# Patient Record
Sex: Male | Born: 1965 | Race: White | Hispanic: No | Marital: Married | Smoking: Never smoker
Health system: Southern US, Community
[De-identification: ages and names within clinical notes are randomized; demographics above are authoritative.]

## PROBLEM LIST (undated history)

## (undated) DIAGNOSIS — E119 Type 2 diabetes mellitus without complications: Secondary | ICD-10-CM

## (undated) DIAGNOSIS — G4730 Sleep apnea, unspecified: Secondary | ICD-10-CM

## (undated) DIAGNOSIS — Z8669 Personal history of other diseases of the nervous system and sense organs: Secondary | ICD-10-CM

## (undated) DIAGNOSIS — Z8719 Personal history of other diseases of the digestive system: Secondary | ICD-10-CM

## (undated) DIAGNOSIS — R7303 Prediabetes: Secondary | ICD-10-CM

## (undated) DIAGNOSIS — I10 Essential (primary) hypertension: Secondary | ICD-10-CM

## (undated) DIAGNOSIS — M48062 Spinal stenosis, lumbar region with neurogenic claudication: Secondary | ICD-10-CM

## (undated) HISTORY — DX: Type 2 diabetes mellitus without complications: E11.9

## (undated) HISTORY — PX: NO PAST SURGERIES: SHX2092

---

## 2004-11-30 ENCOUNTER — Ambulatory Visit: Payer: Self-pay | Admitting: Unknown Physician Specialty

## 2006-02-25 ENCOUNTER — Emergency Department: Payer: Self-pay | Admitting: Unknown Physician Specialty

## 2009-08-12 ENCOUNTER — Ambulatory Visit: Payer: Self-pay | Admitting: Internal Medicine

## 2010-02-17 ENCOUNTER — Ambulatory Visit: Payer: Self-pay | Admitting: Internal Medicine

## 2011-02-12 ENCOUNTER — Ambulatory Visit: Payer: Self-pay | Admitting: Family Medicine

## 2013-08-07 ENCOUNTER — Emergency Department: Payer: Self-pay | Admitting: Emergency Medicine

## 2013-08-07 LAB — CBC WITH DIFFERENTIAL/PLATELET
Eosinophil #: 0.1 10*3/uL (ref 0.0–0.7)
Eosinophil %: 0.9 %
HGB: 14.1 g/dL (ref 13.0–18.0)
Lymphocyte #: 1.8 10*3/uL (ref 1.0–3.6)
MCHC: 34.2 g/dL (ref 32.0–36.0)
MCV: 84 fL (ref 80–100)
Monocyte #: 0.4 x10 3/mm (ref 0.2–1.0)
Monocyte %: 6.9 %
Neutrophil #: 4 10*3/uL (ref 1.4–6.5)
RBC: 4.89 10*6/uL (ref 4.40–5.90)

## 2013-08-07 LAB — COMPREHENSIVE METABOLIC PANEL
Albumin: 3.9 g/dL (ref 3.4–5.0)
Alkaline Phosphatase: 75 U/L (ref 50–136)
Anion Gap: 5 — ABNORMAL LOW (ref 7–16)
BUN: 14 mg/dL (ref 7–18)
Chloride: 103 mmol/L (ref 98–107)
Creatinine: 0.92 mg/dL (ref 0.60–1.30)
EGFR (African American): >60
EGFR (Non-African Amer.): >60
Glucose: 234 mg/dL — ABNORMAL HIGH (ref 65–99)
Osmolality: 280 (ref 275–301)
Potassium: 3.5 mmol/L (ref 3.5–5.1)
SGPT (ALT): 34 U/L (ref 12–78)
Sodium: 136 mmol/L (ref 136–145)
Total Protein: 7.2 g/dL (ref 6.4–8.2)

## 2013-08-07 LAB — CK TOTAL AND CKMB (NOT AT ARMC)
CK, Total: 296 U/L — ABNORMAL HIGH (ref 35–232)
CK-MB: 3.8 ng/mL — ABNORMAL HIGH (ref 0.5–3.6)

## 2014-03-21 ENCOUNTER — Ambulatory Visit: Payer: Self-pay

## 2014-03-23 ENCOUNTER — Ambulatory Visit: Payer: Self-pay

## 2014-04-08 ENCOUNTER — Ambulatory Visit: Payer: Self-pay | Admitting: Unknown Physician Specialty

## 2014-06-04 ENCOUNTER — Other Ambulatory Visit: Payer: Self-pay | Admitting: Neurological Surgery

## 2014-06-04 DIAGNOSIS — M5127 Other intervertebral disc displacement, lumbosacral region: Secondary | ICD-10-CM

## 2014-06-06 ENCOUNTER — Ambulatory Visit
Admission: RE | Admit: 2014-06-06 | Discharge: 2014-06-06 | Disposition: A | Discharge status: Home / Self Care | Payer: BC Managed Care – PPO | Source: Ambulatory Visit | Attending: Neurological Surgery | Admitting: Neurological Surgery

## 2014-06-06 VITALS — BP 135/89 | HR 67

## 2014-06-06 DIAGNOSIS — M5127 Other intervertebral disc displacement, lumbosacral region: Secondary | ICD-10-CM

## 2014-06-06 MED ORDER — METHYLPREDNISOLONE ACETATE 40 MG/ML INJ SUSP (RADIOLOG
120.0000 mg | Freq: Once | INTRAMUSCULAR | Status: AC
Start: 2014-06-06 — End: 2014-06-06
  Administered 2014-06-06: 120 mg via EPIDURAL

## 2014-06-06 MED ORDER — IOHEXOL 180 MG/ML  SOLN
1.0000 mL | Freq: Once | INTRAMUSCULAR | Status: AC | PRN
  Administered 2014-06-06: 1 mL via EPIDURAL

## 2014-06-06 NOTE — Discharge Instructions (Signed)

## 2018-03-19 ENCOUNTER — Other Ambulatory Visit: Payer: Self-pay

## 2018-03-19 ENCOUNTER — Emergency Department
Admission: EM | Admit: 2018-03-19 | Discharge: 2018-03-19 | Disposition: A | Discharge status: Home / Self Care | Payer: BLUE CROSS/BLUE SHIELD | Attending: Emergency Medicine | Admitting: Emergency Medicine

## 2018-03-19 DIAGNOSIS — Z79899 Other long term (current) drug therapy: Secondary | ICD-10-CM | POA: Diagnosis not present

## 2018-03-19 DIAGNOSIS — M5416 Radiculopathy, lumbar region: Secondary | ICD-10-CM | POA: Diagnosis not present

## 2018-03-19 DIAGNOSIS — M545 Low back pain: Secondary | ICD-10-CM | POA: Diagnosis present

## 2018-03-19 MED ORDER — KETOROLAC TROMETHAMINE 60 MG/2ML IM SOLN
60.0000 mg | Freq: Once | INTRAMUSCULAR | Status: AC
  Administered 2018-03-19: 60 mg via INTRAMUSCULAR
  Filled 2018-03-19: qty 2

## 2018-03-19 MED ORDER — DEXAMETHASONE SODIUM PHOSPHATE 10 MG/ML IJ SOLN
10.0000 mg | Freq: Once | INTRAMUSCULAR | Status: AC
  Administered 2018-03-19: 10 mg via INTRAMUSCULAR
  Filled 2018-03-19: qty 1

## 2018-03-19 MED ORDER — OXYCODONE-ACETAMINOPHEN 7.5-325 MG PO TABS: 1.0000 | ORAL_TABLET | Freq: Four times a day (QID) | ORAL | 0 refills | Status: DC | PRN

## 2018-03-19 MED ORDER — HYDROMORPHONE HCL 1 MG/ML IJ SOLN
1.0000 mg | Freq: Once | INTRAMUSCULAR | Status: AC
  Administered 2018-03-19: 1 mg via INTRAMUSCULAR
  Filled 2018-03-19: qty 1

## 2018-03-19 NOTE — ED Provider Notes (Signed)
[facility] Emergency Department Provider Note   ____________________________________________   First MD Initiated Contact with Patient 03/19/18 [phone]     (approximate)  I have reviewed the triage vital signs and the nursing notes.   HISTORY  Chief Complaint Back Pain    HPI Johnathan Williams is a 52 y.o. male patient complaint acute onset of low back pain 3 days ago.  Patient went to urgent care clinic and was given a prescription for Medrol Dosepak and Flexeril.  Patient states medication is not helping.  Patient has a history of herniated and bulging disks.  Patient stated unable to see treating orthopedics over the weekend.  Patient rates his pain as a 10/10.  Patient denies bladder or bowel dysfunction.  Patient state pain radiates to bilateral lower extremity.  Patient described the pain as "shooting".  No past medical history on file.  There are no active problems to display for this patient.     Prior to Admission medications   Medication Sig Start Date End Date Taking? Authorizing Provider  meloxicam (MOBIC) 15 MG tablet Take by mouth.    [provider]  orphenadrine (NORFLEX) 100 MG tablet Take by mouth.    [provider]  oxyCODONE (OXY IR/ROXICODONE) 5 MG immediate release tablet Take by mouth. 03/26/14   [provider]  oxyCODONE-acetaminophen (PERCOCET) 7.5-325 MG tablet Take 1 tablet by mouth every 6 (six) hours as needed for severe pain. 03/19/18   Joni Reining, PA-C  predniSONE (DELTASONE) 10 MG tablet Take by mouth.    [provider]    Allergies Patient has no known allergies.  No family history on file.  Social History Social History   Tobacco Use  . Smoking status: Never Smoker  . Smokeless tobacco: Never Used  Substance Use Topics  . Alcohol use: Not on file  . Drug use: Not on file    Review of Systems Constitutional: No fever/chills Eyes: No visual changes. ENT: No sore  throat. Cardiovascular: Denies chest pain. Respiratory: Denies shortness of breath. Gastrointestinal: No abdominal pain.  No nausea, no vomiting.  No diarrhea.  No constipation. Genitourinary: Negative for dysuria. Musculoskeletal: Positive for back pain. Skin: Negative for rash. Neurological: Negative for headaches, focal weakness or numbness.   ____________________________________________   PHYSICAL EXAM:  VITAL SIGNS: ED Triage Vitals  Enc Vitals Group     BP 03/19/18 0111 (!) 149/87     Pulse Rate 03/19/18 0111 86     Resp 03/19/18 0111 20     Temp 03/19/18 0111 97.7 F (36.5 C)     Temp Source 03/19/18 0111 Oral     SpO2 03/19/18 0111 97 %     Weight 03/19/18 0110 250 lb (113.4 kg)     Height 03/19/18 0110  (1.753 m)     Head Circumference --      Peak Flow --      Pain Score 03/19/18 0111 10     Pain Loc --      Pain Edu? --      Excl. in [place]? --    Constitutional: Alert and oriented.  Moderate distress.   Hematological/Lymphatic/Immunilogical: No cervical lymphadenopathy. Cardiovascular: Normal rate, regular rhythm. Grossly normal heart sounds.  Good peripheral circulation. Respiratory: Normal respiratory effort.  No retractions. Lungs CTAB. Musculoskeletal: No obvious spinal deformity.  Patient decreased range of motion is all feels.  Patient has positive left straight leg test.  No lower extremity tenderness nor edema.  No joint effusions. Neurologic:  Normal speech and language. No gross focal neurologic deficits are appreciated. No gait instability. Skin:  Skin is warm, dry and intact. No rash noted. Psychiatric: Mood and affect are normal. Speech and behavior are normal.  ____________________________________________   LABS (all labs ordered are listed, but only abnormal results are displayed)  Labs Reviewed - No data to display ____________________________________________  EKG   ____________________________________________  RADIOLOGY  ED MD  interpretation:    Official radiology report(s): No results found.  ____________________________________________   PROCEDURES  Procedure(s) performed: None  Procedures  Critical Care performed: No  ____________________________________________   INITIAL IMPRESSION / ASSESSMENT AND PLAN / ED COURSE  As part of my medical decision making, I reviewed the following data within the electronic MEDICAL RECORD NUMBER    Radicular back pain secondary to multiple bulging disks.  Patient advised to continue muscle relaxant steroids.  Patient given a prescription for Percocet.  Advised to contact treating orthopedics tomorrow morning to schedule appointment.      ____________________________________________   FINAL CLINICAL IMPRESSION(S) / ED DIAGNOSES  Final diagnoses:  Acute radicular low back pain     ED Discharge Orders        Ordered    oxyCODONE-acetaminophen (PERCOCET) 7.5-325 MG tablet  Every 6 hours PRN     03/19/18 0716       Note:  This document was prepared using Dragon voice recognition software and may include unintentional dictation errors.    Joni Reining, PA-C 03/19/18 1610    Pershing Proud Myra Rude, MD 03/20/18 1325

## 2018-03-19 NOTE — ED Notes (Signed)
See triage note  Presents with lower back pain  States he was seen [facility]   Placed on steroid taper and muscle relaxer's   States pain became worse last pm

## 2018-03-19 NOTE — ED Triage Notes (Signed)
Patient reports lower back pain since Thursday that radiates down legs.  Reports seen at Urgent Care on Thursday but medication prescribed not helping.

## 2019-11-26 ENCOUNTER — Encounter: Payer: Self-pay | Admitting: Emergency Medicine

## 2019-11-26 ENCOUNTER — Other Ambulatory Visit: Payer: Self-pay

## 2019-11-26 ENCOUNTER — Emergency Department
Admission: EM | Admit: 2019-11-26 | Discharge: 2019-11-26 | Disposition: A | Discharge status: Home / Self Care | Payer: BC Managed Care – PPO | Attending: Emergency Medicine | Admitting: Emergency Medicine

## 2019-11-26 ENCOUNTER — Emergency Department: Payer: BC Managed Care – PPO

## 2019-11-26 DIAGNOSIS — U071 COVID-19: Secondary | ICD-10-CM | POA: Insufficient documentation

## 2019-11-26 DIAGNOSIS — Z79899 Other long term (current) drug therapy: Secondary | ICD-10-CM | POA: Insufficient documentation

## 2019-11-26 DIAGNOSIS — R5383 Other fatigue: Secondary | ICD-10-CM | POA: Diagnosis present

## 2019-11-26 HISTORY — DX: Prediabetes: R73.03

## 2019-11-26 LAB — CBC WITH DIFFERENTIAL/PLATELET
Abs Immature Granulocytes: 0.01 10*3/uL (ref 0.00–0.07)
Basophils Absolute: 0 10*3/uL (ref 0.0–0.1)
Basophils Relative: 0 %
Eosinophils Absolute: 0 10*3/uL (ref 0.0–0.5)
Eosinophils Relative: 0 %
HCT: 45.7 % (ref 39.0–52.0)
Hemoglobin: 15.6 g/dL (ref 13.0–17.0)
Immature Granulocytes: 0 %
Lymphocytes Relative: 28 %
Lymphs Abs: 1.1 10*3/uL (ref 0.7–4.0)
MCH: 28 pg (ref 26.0–34.0)
MCHC: 34.1 g/dL (ref 30.0–36.0)
MCV: 81.9 fL (ref 80.0–100.0)
Monocytes Absolute: 0.3 10*3/uL (ref 0.1–1.0)
Monocytes Relative: 8 %
Neutro Abs: 2.5 10*3/uL (ref 1.7–7.7)
Neutrophils Relative %: 64 %
Platelets: 154 10*3/uL (ref 150–400)
RBC: 5.58 MIL/uL (ref 4.22–5.81)
RDW: 12.4 % (ref 11.5–15.5)
WBC: 3.9 10*3/uL — ABNORMAL LOW (ref 4.0–10.5)
nRBC: 0 % (ref 0.0–0.2)

## 2019-11-26 LAB — BASIC METABOLIC PANEL
Anion gap: 14 (ref 5–15)
BUN: 11 mg/dL (ref 6–20)
CO2: 23 mmol/L (ref 22–32)
Calcium: 8.5 mg/dL — ABNORMAL LOW (ref 8.9–10.3)
Chloride: 98 mmol/L (ref 98–111)
Creatinine, Ser: 0.83 mg/dL (ref 0.61–1.24)
GFR calc Af Amer: >60 mL/min (ref >60)
GFR calc non Af Amer: >60 mL/min (ref >60)
Glucose, Bld: 312 mg/dL — ABNORMAL HIGH (ref 70–99)
Potassium: 3.8 mmol/L (ref 3.5–5.1)
Sodium: 135 mmol/L (ref 135–145)

## 2019-11-26 MED ORDER — AZITHROMYCIN 250 MG PO TABS: ORAL_TABLET | ORAL | 0 refills | Status: DC

## 2019-11-26 MED ORDER — SODIUM CHLORIDE 0.9 % IV BOLUS
500.0000 mL | Freq: Once | INTRAVENOUS | Status: AC
  Administered 2019-11-26: 500 mL via INTRAVENOUS

## 2019-11-26 MED ORDER — PREDNISONE 20 MG PO TABS
60.0000 mg | ORAL_TABLET | Freq: Once | ORAL | Status: AC
  Administered 2019-11-26: 60 mg via ORAL
  Filled 2019-11-26: qty 3

## 2019-11-26 MED ORDER — ONDANSETRON 8 MG PO TBDP
8.0000 mg | ORAL_TABLET | Freq: Once | ORAL | Status: AC
Start: 2019-11-26 — End: 2019-11-26
  Administered 2019-11-26: 8 mg via ORAL

## 2019-11-26 MED ORDER — PREDNISONE 20 MG PO TABS: 60.0000 mg | ORAL_TABLET | Freq: Every day | ORAL | 0 refills | Status: AC

## 2019-11-26 MED ORDER — ONDANSETRON 8 MG PO TBDP: 8.0000 mg | ORAL_TABLET | Freq: Three times a day (TID) | ORAL | 0 refills | Status: DC | PRN

## 2019-11-26 NOTE — ED Provider Notes (Signed)
[facility] Emergency Department Provider Note ____________________________________________   First MD Initiated Contact with Patient 11/26/19 1312     (approximate)  I have reviewed the triage vital signs and the nursing notes.   HISTORY  Chief Complaint Generalized Body Aches, Nausea, and Fever    HPI HOGAN HOOBLER is a 54 y.o. male with PMH as noted below who presents with generalized fatigue over the last several days, gradual onset, associated with body aches, nausea, decreased appetite, and some mild shortness of breath.  The patient has also had diarrhea but no vomiting.  He states that he initially got sick about 10 days ago and was diagnosed with COVID-19 last week.  His wife is also positive.  Past Medical History:  Diagnosis Date  . Pre-diabetes     There are no problems to display for this patient.   History reviewed. No pertinent surgical history.  Prior to Admission medications   Medication Sig Start Date End Date Taking? Authorizing Provider  azithromycin (ZITHROMAX Z-PAK) 250 MG tablet Take 2 tablets (500 mg) on  Day 1,  followed by 1 tablet (250 mg) once daily on Days 2 through 5. 11/26/19 12/01/19  Arta Silence, MD  meloxicam (MOBIC) 15 MG tablet Take by mouth.    [provider]  ondansetron (ZOFRAN ODT) 8 MG disintegrating tablet Take 1 tablet (8 mg total) by mouth every 8 (eight) hours as needed for nausea or vomiting. 11/26/19   Arta Silence, MD  oxyCODONE (OXY IR/ROXICODONE) 5 MG immediate release tablet Take by mouth. 03/26/14   [provider]  predniSONE (DELTASONE) 20 MG tablet Take 3 tablets (60 mg total) by mouth daily for 5 days. 11/26/19 12/01/19  Arta Silence, MD    Allergies Patient has no known allergies.  No family history on file.  Social History Social History   Tobacco Use  . Smoking status: Never Smoker  . Smokeless tobacco: Never Used  Substance Use Topics  .  Alcohol use: Not Currently  . Drug use: Not Currently    Review of Systems  Constitutional: Positive for fatigue. Eyes: No redness. ENT: No sore throat. Cardiovascular: Denies chest pain. Respiratory: Positive for mild shortness of breath. Gastrointestinal: Positive for nausea and diarrhea. Genitourinary: Negative for dysuria.  Musculoskeletal: Negative for back pain. Skin: Negative for rash. Neurological: Negative for headache.  ____________________________________________   PHYSICAL EXAM:  VITAL SIGNS: ED Triage Vitals  Enc Vitals Group     BP 11/26/19 1256 119/81     Pulse Rate 11/26/19 1256 78     Resp 11/26/19 1256 20     Temp 11/26/19 1256 98.5 F (36.9 C)     Temp Source 11/26/19 1256 Oral     SpO2 11/26/19 1256 97 %     Weight 11/26/19 1257 250 lb (113.4 kg)     Height 11/26/19 1257 5\' 8"  (1.727 m)     Head Circumference --      Peak Flow --      Pain Score 11/26/19 1256 9     Pain Loc --      Pain Edu? --      Excl. in [place]? --     Constitutional: Alert and oriented. Well appearing and in no acute distress. Eyes: Conjunctivae are normal.  Head: Atraumatic. Nose: No congestion/rhinnorhea. Mouth/Throat: Mucous membranes are moist.   Neck: Normal range of motion.  Cardiovascular: Normal rate, regular rhythm.  Good peripheral circulation. Respiratory: Normal respiratory effort.  No retractions.  Gastrointestinal: No distention.  Musculoskeletal:  Extremities warm and well perfused.  Neurologic:  Normal speech and language. No gross focal neurologic deficits are appreciated.  Skin:  Skin is warm and dry. No rash noted. Psychiatric: Mood and affect are normal. Speech and behavior are normal.  ____________________________________________   LABS (all labs ordered are listed, but only abnormal results are displayed)  Labs Reviewed  BASIC METABOLIC PANEL - Abnormal; Notable for the following components:      Result Value   Glucose, Bld 312 (*)    Calcium  8.5 (*)    All other components within normal limits  CBC WITH DIFFERENTIAL/PLATELET - Abnormal; Notable for the following components:   WBC 3.9 (*)    All other components within normal limits   ____________________________________________  EKG   ____________________________________________  RADIOLOGY  CXR: Bilateral patchy infiltrates  ____________________________________________   PROCEDURES  Procedure(s) performed: No  Procedures  Critical Care performed: No ____________________________________________   INITIAL IMPRESSION / ASSESSMENT AND PLAN / ED COURSE  Pertinent labs & imaging results that were available during my care of the patient were reviewed by me and considered in my medical decision making (see chart for details).  54 year old male with PMH as noted above presents with worsening fatigue, nausea, decreased p.o. intake, and some shortness of breath after being diagnosed with COVID-19 last week.  His wife is also positive for Covid and is a patient in the ED today.  On exam, the patient is overall well-appearing.  His vital signs are normal.  O2 saturation is in the high 90s on room air.  The physical exam is otherwise unremarkable.  Presentation is consistent with symptoms related to COVID-19.  We will obtain a chest x-ray and basic labs to help risk stratify the patient and determine appropriate treatment, however given that he is not requiring oxygen I anticipate that he will be stable for discharge home.  ----------------------------------------- 3:27 PM on 11/26/2019 -----------------------------------------  Chest x-ray shows mild bilateral infiltrates consistent with COVID-19.  Lab work-up is unremarkable except for hyperglycemia with no evidence of DKA.  This has been addressed with fluids.  The patient is stable for discharge home at this time.  I counseled him on the results of the work-up.  I have prescribed prednisone, azithromycin, and Zofran.   Return precautions given, and he expresses understanding.  ____________________________________________   FINAL CLINICAL IMPRESSION(S) / ED DIAGNOSES  Final diagnoses:  COVID-19      NEW MEDICATIONS STARTED DURING THIS VISIT:  New Prescriptions   AZITHROMYCIN (ZITHROMAX Z-PAK) 250 MG TABLET    Take 2 tablets (500 mg) on  Day 1,  followed by 1 tablet (250 mg) once daily on Days 2 through 5.   ONDANSETRON (ZOFRAN ODT) 8 MG DISINTEGRATING TABLET    Take 1 tablet (8 mg total) by mouth every 8 (eight) hours as needed for nausea or vomiting.   PREDNISONE (DELTASONE) 20 MG TABLET    Take 3 tablets (60 mg total) by mouth daily for 5 days.     Note:  This document was prepared using Dragon voice recognition software and may include unintentional dictation errors.    Dionne Bucy, MD 11/26/19 1527

## 2019-11-26 NOTE — Discharge Instructions (Signed)
Take the prednisone and azithromycin as prescribed and finish the full course.  You may take the Zofran as needed for nausea.  Return to the ER for new or worsening shortness of breath, nausea, vomiting, weakness, high fevers, or any other new or worsening symptoms that concern you.  Follow-up with your doctor in 1 to 2 weeks to monitor your glucose level and determine if need to be put on anything for the elevated glucose.

## 2019-11-26 NOTE — ED Triage Notes (Signed)
Pt presents to ED via POV with his wife who is also a patient. Pt states tested positive for Covid on Monday. Pt c/o fatigue, nausea, generalized body aches. Pt ambulatory without difficulty at this time, able to speak in full and complete sentences without difficulty.

## 2019-11-26 NOTE — ED Notes (Signed)
See triage note  States he was dx'd with COVID last Monday  States his wife and daughter were positive also   States he is having nausea more body aches and increased fatigue  Afebrile at present

## 2019-11-29 ENCOUNTER — Encounter: Payer: Self-pay | Admitting: Emergency Medicine

## 2019-11-29 ENCOUNTER — Emergency Department
Admission: EM | Admit: 2019-11-29 | Discharge: 2019-11-29 | Disposition: A | Discharge status: Home / Self Care | Payer: BC Managed Care – PPO | Attending: Emergency Medicine | Admitting: Emergency Medicine

## 2019-11-29 ENCOUNTER — Other Ambulatory Visit: Payer: Self-pay

## 2019-11-29 ENCOUNTER — Emergency Department: Payer: BC Managed Care – PPO

## 2019-11-29 DIAGNOSIS — J1282 Pneumonia due to coronavirus disease 2019: Secondary | ICD-10-CM | POA: Insufficient documentation

## 2019-11-29 DIAGNOSIS — R739 Hyperglycemia, unspecified: Secondary | ICD-10-CM | POA: Insufficient documentation

## 2019-11-29 DIAGNOSIS — U071 COVID-19: Secondary | ICD-10-CM | POA: Diagnosis not present

## 2019-11-29 DIAGNOSIS — R0602 Shortness of breath: Secondary | ICD-10-CM | POA: Diagnosis present

## 2019-11-29 LAB — CBC WITH DIFFERENTIAL/PLATELET
Abs Immature Granulocytes: 0.03 10*3/uL (ref 0.00–0.07)
Basophils Absolute: 0 10*3/uL (ref 0.0–0.1)
Basophils Relative: 0 %
Eosinophils Absolute: 0 10*3/uL (ref 0.0–0.5)
Eosinophils Relative: 0 %
HCT: 45.6 % (ref 39.0–52.0)
Hemoglobin: 15.4 g/dL (ref 13.0–17.0)
Immature Granulocytes: 0 %
Lymphocytes Relative: 13 %
Lymphs Abs: 1.1 10*3/uL (ref 0.7–4.0)
MCH: 27.6 pg (ref 26.0–34.0)
MCHC: 33.8 g/dL (ref 30.0–36.0)
MCV: 81.7 fL (ref 80.0–100.0)
Monocytes Absolute: 0.6 10*3/uL (ref 0.1–1.0)
Monocytes Relative: 7 %
Neutro Abs: 6.9 10*3/uL (ref 1.7–7.7)
Neutrophils Relative %: 80 %
Platelets: 244 10*3/uL (ref 150–400)
RBC: 5.58 MIL/uL (ref 4.22–5.81)
RDW: 12.4 % (ref 11.5–15.5)
WBC: 8.6 10*3/uL (ref 4.0–10.5)
nRBC: 0 % (ref 0.0–0.2)

## 2019-11-29 LAB — BASIC METABOLIC PANEL
Anion gap: 14 (ref 5–15)
BUN: 12 mg/dL (ref 6–20)
CO2: 24 mmol/L (ref 22–32)
Calcium: 8.9 mg/dL (ref 8.9–10.3)
Chloride: 95 mmol/L — ABNORMAL LOW (ref 98–111)
Creatinine, Ser: 0.93 mg/dL (ref 0.61–1.24)
GFR calc Af Amer: >60 mL/min (ref >60)
GFR calc non Af Amer: >60 mL/min (ref >60)
Glucose, Bld: 384 mg/dL — ABNORMAL HIGH (ref 70–99)
Potassium: 3.5 mmol/L (ref 3.5–5.1)
Sodium: 133 mmol/L — ABNORMAL LOW (ref 135–145)

## 2019-11-29 LAB — LACTIC ACID, PLASMA: Lactic Acid, Venous: 1.6 mmol/L (ref 0.5–1.9)

## 2019-11-29 LAB — FIBRIN DERIVATIVES D-DIMER (ARMC ONLY): Fibrin derivatives D-dimer (ARMC): 482.28 ng/mL (FEU) (ref 0.00–499.00)

## 2019-11-29 LAB — TROPONIN I (HIGH SENSITIVITY): Troponin I (High Sensitivity): 5 ng/L (ref <18)

## 2019-11-29 MED ORDER — PREDNISONE 10 MG (21) PO TBPK: ORAL_TABLET | ORAL | 0 refills | Status: DC

## 2019-11-29 MED ORDER — LEVOFLOXACIN 750 MG PO TABS: 750.0000 mg | ORAL_TABLET | Freq: Every day | ORAL | 0 refills | Status: AC

## 2019-11-29 MED ORDER — GUAIFENESIN-CODEINE 100-10 MG/5ML PO SYRP: 5.0000 mL | ORAL_SOLUTION | Freq: Three times a day (TID) | ORAL | 0 refills | Status: DC | PRN

## 2019-11-29 MED ORDER — SODIUM CHLORIDE 0.9 % IV SOLN
2.0000 g | Freq: Once | INTRAVENOUS | Status: AC
  Administered 2019-11-29: 16:00:00 2 g via INTRAVENOUS
  Filled 2019-11-29: qty 20

## 2019-11-29 MED ORDER — HYDROCOD POLST-CPM POLST ER 10-8 MG/5ML PO SUER
5.0000 mL | Freq: Once | ORAL | Status: AC
  Administered 2019-11-29: 5 mL via ORAL
  Filled 2019-11-29: qty 5

## 2019-11-29 MED ORDER — ALBUTEROL SULFATE HFA 108 (90 BASE) MCG/ACT IN AERS
2.0000 | INHALATION_SPRAY | Freq: Once | RESPIRATORY_TRACT | Status: AC
  Administered 2019-11-29: 2 via RESPIRATORY_TRACT
  Filled 2019-11-29: qty 6.7

## 2019-11-29 NOTE — ED Triage Notes (Signed)
Patient to ER for c/o increased shortness of breath after testing positive for Covid (approx 11/19/19). Was seen in ER on 1/25 for the c/o the same. +Cough with thick sputum that patient is unable to cough up.

## 2019-11-29 NOTE — Discharge Instructions (Signed)
Stop the azithromycin antibiotic. Take the Levaquin (new antibiotic) daily.  Complete the prednisone you already have, then start the new pack prescribed today.  Follow up with primary care if not improving over the next few days.  Return to the ER for symptoms that change or worsen if unable to see primary care.

## 2019-11-29 NOTE — ED Notes (Signed)
See triage note  States him and his wife were tested positive for COVID last week  Was placed on z-max and steroids 3 days ago    States this am he felt like he is not able to breath    Feels like he can't cough up anything

## 2019-11-29 NOTE — ED Provider Notes (Signed)
[facility] Emergency Department Provider Note  ____________________________________________   First MD Initiated Contact with Patient 11/29/19 1504     (approximate)  I have reviewed the triage vital signs and the nursing notes.   HISTORY  Chief Complaint Shortness of Breath   HPI Johnathan Williams is a 54 y.o. male presenting to the emergency department for treatment and evaluation of shortness of breath.  He was diagnosed with COVID-19  on November 19, 2019.  He was evaluated here in the emergency department on the 25th and was prescribed azithromycin, Zofran, and prednisone.  Patient states that he has been taking his medications as prescribed but his shortness of breath continues to worsen.  He states that he becomes very "winded" with any exertion.  Cough is persistent when he attempts to lay down.  No fever that he has noticed.  He denies history of lung disease.  He does not use tobacco products.  Past Medical History:  Diagnosis Date  . Pre-diabetes     There are no problems to display for this patient.   History reviewed. No pertinent surgical history.  Prior to Admission medications   Medication Sig Start Date End Date Taking? Authorizing Provider  guaiFENesin-codeine (ROBITUSSIN AC) 100-10 MG/5ML syrup Take 5 mLs by mouth 3 (three) times daily as needed for cough. 11/29/19   Aleea Hendry, Rulon Eisenmenger B, FNP  levofloxacin (LEVAQUIN) 750 MG tablet Take 1 tablet (750 mg total) by mouth daily for 7 days. 11/29/19 12/06/19  Kalief Kattner, Rulon Eisenmenger B, FNP  meloxicam (MOBIC) 15 MG tablet Take by mouth.    [provider]  ondansetron (ZOFRAN ODT) 8 MG disintegrating tablet Take 1 tablet (8 mg total) by mouth every 8 (eight) hours as needed for nausea or vomiting. 11/26/19   Dionne Bucy, MD  oxyCODONE (OXY IR/ROXICODONE) 5 MG immediate release tablet Take by mouth. 03/26/14   [provider]  predniSONE (DELTASONE) 20 MG tablet Take 3 tablets (60 mg  total) by mouth daily for 5 days. 11/26/19 12/01/19  Dionne Bucy, MD  predniSONE (STERAPRED UNI-PAK 21 TAB) 10 MG (21) TBPK tablet Take 6 tablets on the first day and decrease by 1 tablet each day until finished. 11/29/19   Chinita Pester, FNP    Allergies Patient has no known allergies.  No family history on file.  Social History Social History   Tobacco Use  . Smoking status: Never Smoker  . Smokeless tobacco: Never Used  Substance Use Topics  . Alcohol use: Not Currently  . Drug use: Not Currently    Review of Systems  Constitutional: No fever/chills. Eyes: No visual changes. ENT: No sore throat. Cardiovascular: Positive for chest pressure.  Negative for pleuritic pain.  Negative for palpitations.  Negative for leg pain. Respiratory: Positive shortness of breath. Gastrointestinal: Negative for abdominal pain.  No nausea, no vomiting.  No diarrhea.  No constipation. Genitourinary: Negative for dysuria. Musculoskeletal: Negative for back pain.  Skin: Negative for rash, lesion, wound. Neurological: Negative for headaches, focal weakness or numbness. ____________________________________________   PHYSICAL EXAM:  VITAL SIGNS: ED Triage Vitals  Enc Vitals Group     BP 11/29/19 1424 (!) 141/78     Pulse Rate 11/29/19 1424 (!) 102     Resp 11/29/19 1424 20     Temp 11/29/19 1424 99.1 F (37.3 C)     Temp Source 11/29/19 1424 Oral     SpO2 11/29/19 1424 95 %     Weight --  Height --      Head Circumference --      Peak Flow --      Pain Score 11/29/19 1427 4     Pain Loc --      Pain Edu? --      Excl. in [place]? --     Constitutional: Alert and oriented.  Acutely ill appearing and in no acute distress.  Normal mental status. Eyes: Conjunctivae are normal. PERRL. Head: Atraumatic. Nose: No congestion/rhinnorhea. Mouth/Throat: Mucous membranes are moist.  Oropharynx non-erythematous. Tongue normal in size and color. Neck: No stridor.  Normal carotid bruit  appreciated on exam. Hematological/Lymphatic/Immunilogical: No cervical lymphadenopathy. Cardiovascular: Normal rate, regular rhythm. Grossly normal heart sounds.  Good peripheral circulation. Respiratory: Normal respiratory effort.  No retractions.  Breath sounds diminished. Gastrointestinal: Soft and nontender. No distention. No abdominal bruits. No CVA tenderness. Genitourinary: Exam deferred. Musculoskeletal: No lower extremity tenderness.  No edema of extremities. Neurologic:  Normal speech and language. No gross focal neurologic deficits are appreciated. Skin:  Skin is warm, dry and intact. No rash noted. Psychiatric: Mood and affect are normal. Speech and behavior are normal.  ____________________________________________   LABS (all labs ordered are listed, but only abnormal results are displayed)  Labs Reviewed  BASIC METABOLIC PANEL - Abnormal; Notable for the following components:      Result Value   Sodium 133 (*)    Chloride 95 (*)    Glucose, Bld 384 (*)    All other components within normal limits  CULTURE, BLOOD (ROUTINE X 2)  CULTURE, BLOOD (ROUTINE X 2)  CBC WITH DIFFERENTIAL/PLATELET  FIBRIN DERIVATIVES D-DIMER ([facility] ONLY)  LACTIC ACID, PLASMA  TROPONIN I (HIGH SENSITIVITY)   ____________________________________________  EKG  ED ECG REPORT I, Richard Holz, FNP-BC personally viewed and interpreted this ECG.   Date: 11/29/2019  EKG Time: 1432  Rate: 105  Rhythm: normal EKG, normal sinus rhythm  Axis: normal  Intervals:none  ST&T Change: no ST elevation  ____________________________________________  RADIOLOGY  ED MD interpretation: Worsening opacity in the right upper lobe in comparison to x-ray 3 days ago.  I, Kem Boroughs, personally viewed and evaluated these images (plain radiographs) as part of my medical decision making, as well as reviewing the written report by the radiologist.  Official radiology report(s): DG Chest 2 View  Result  Date: 11/29/2019 CLINICAL DATA:  Shortness of breath.  COVID-19 positive EXAM: CHEST - 2 VIEW COMPARISON:  November 26, 2019 FINDINGS: There is slight increase in ill-defined opacity in the right upper lobe. There is slight atelectasis in the right base. Left lung is clear. Heart size and pulmonary vascularity are normal. No adenopathy. No bone lesions. IMPRESSION: Slight increase in ill-defined airspace opacity in the right upper lobe. Suspect atypical organism pneumonia in this area. Mild right base atelectasis. Left lung clear. Cardiac silhouette normal. No adenopathy. Electronically Signed   By: Bretta Bang III M.D.   On: 11/29/2019 15:14    ____________________________________________   PROCEDURES  Procedure(s) performed: None  Procedures  Critical Care performed: No  ____________________________________________   INITIAL IMPRESSION / ASSESSMENT AND PLAN / ED COURSE  As part of my medical decision making, I reviewed the following data within the electronic MEDICAL RECORD NUMBER Notes from prior ED visits  54 year old male presenting to the emergency department for treatment and evaluation of worsening COVID-19 symptoms.  See HPI for further details.  Plan will be to add labs to those drawn while awaiting ER room assignment.  Ambulatory sat  will also be requested.  He has been taking azithromycin as prescribed so IV Rocephin will be given alone.  Disposition is depending on the ambulatory saturation.  Differential diagnosis includes but is not limited to: COVID-19, pneumonia related to COVID-19, pulmonary embolus, cardiac event  WBC now 8.6 in comparison to 3.9 three days ago. Glucose elevated at 384, which seems close to his baseline.  Lactic acid, D-dimer, and troponin are all reassuring.  Ambulatory saturation is 95 to 100% on room air.   Plan will be to change him from azithromycin to Levaquin, extend his dose of prednisone, prescribe Robitussin-AC for his cough, and have him  continue taking the Zofran if needed for nausea.  He was also advised to take Tylenol or ibuprofen for his body aches or fever.  The albuterol inhaler is to be used every 4-6 hours.  He is to schedule an appointment with his primary care provider if he does not feel like he is improving at all over the next few days. He is to return to the emergency department for symptoms of change or worsen if he is unable to see primary care. ____________________________________________   FINAL CLINICAL IMPRESSION(S) / ED DIAGNOSES  Final diagnoses:  Pneumonia due to COVID-19 virus  Hyperglycemia     ED Discharge Orders         Ordered    levofloxacin (LEVAQUIN) 750 MG tablet  Daily     11/29/19 1738    predniSONE (STERAPRED UNI-PAK 21 TAB) 10 MG (21) TBPK tablet     11/29/19 1738    guaiFENesin-codeine (ROBITUSSIN AC) 100-10 MG/5ML syrup  3 times daily PRN     11/29/19 1738           Westin Knotts Huhn was evaluated in Emergency Department on 11/29/2019 for the symptoms described in the history of present illness. He was evaluated in the context of the global COVID-19 pandemic, which necessitated consideration that the patient might be at risk for infection with the SARS-CoV-2 virus that causes COVID-19. Institutional protocols and algorithms that pertain to the evaluation of patients at risk for COVID-19 are in a state of rapid change based on information released by regulatory bodies including the CDC and federal and state organizations. These policies and algorithms were followed during the patient's care in the ED.   Note:  This document was prepared using Dragon voice recognition software and may include unintentional dictation errors.   Victorino Dike, FNP 11/29/19 2025    Carrie Mew, MD 11/30/19 0000

## 2019-12-04 LAB — CULTURE, BLOOD (ROUTINE X 2)
Culture: NO GROWTH
Culture: NO GROWTH

## 2020-02-27 IMAGING — DX DG CHEST 1V PORT
1 series · 1 of 1 positions shown · non-contrast
Comparison: 08/07/2013

CLINICAL DATA: Cough. [id number].

EXAM:
PORTABLE CHEST 1 VIEW

[chest ap]
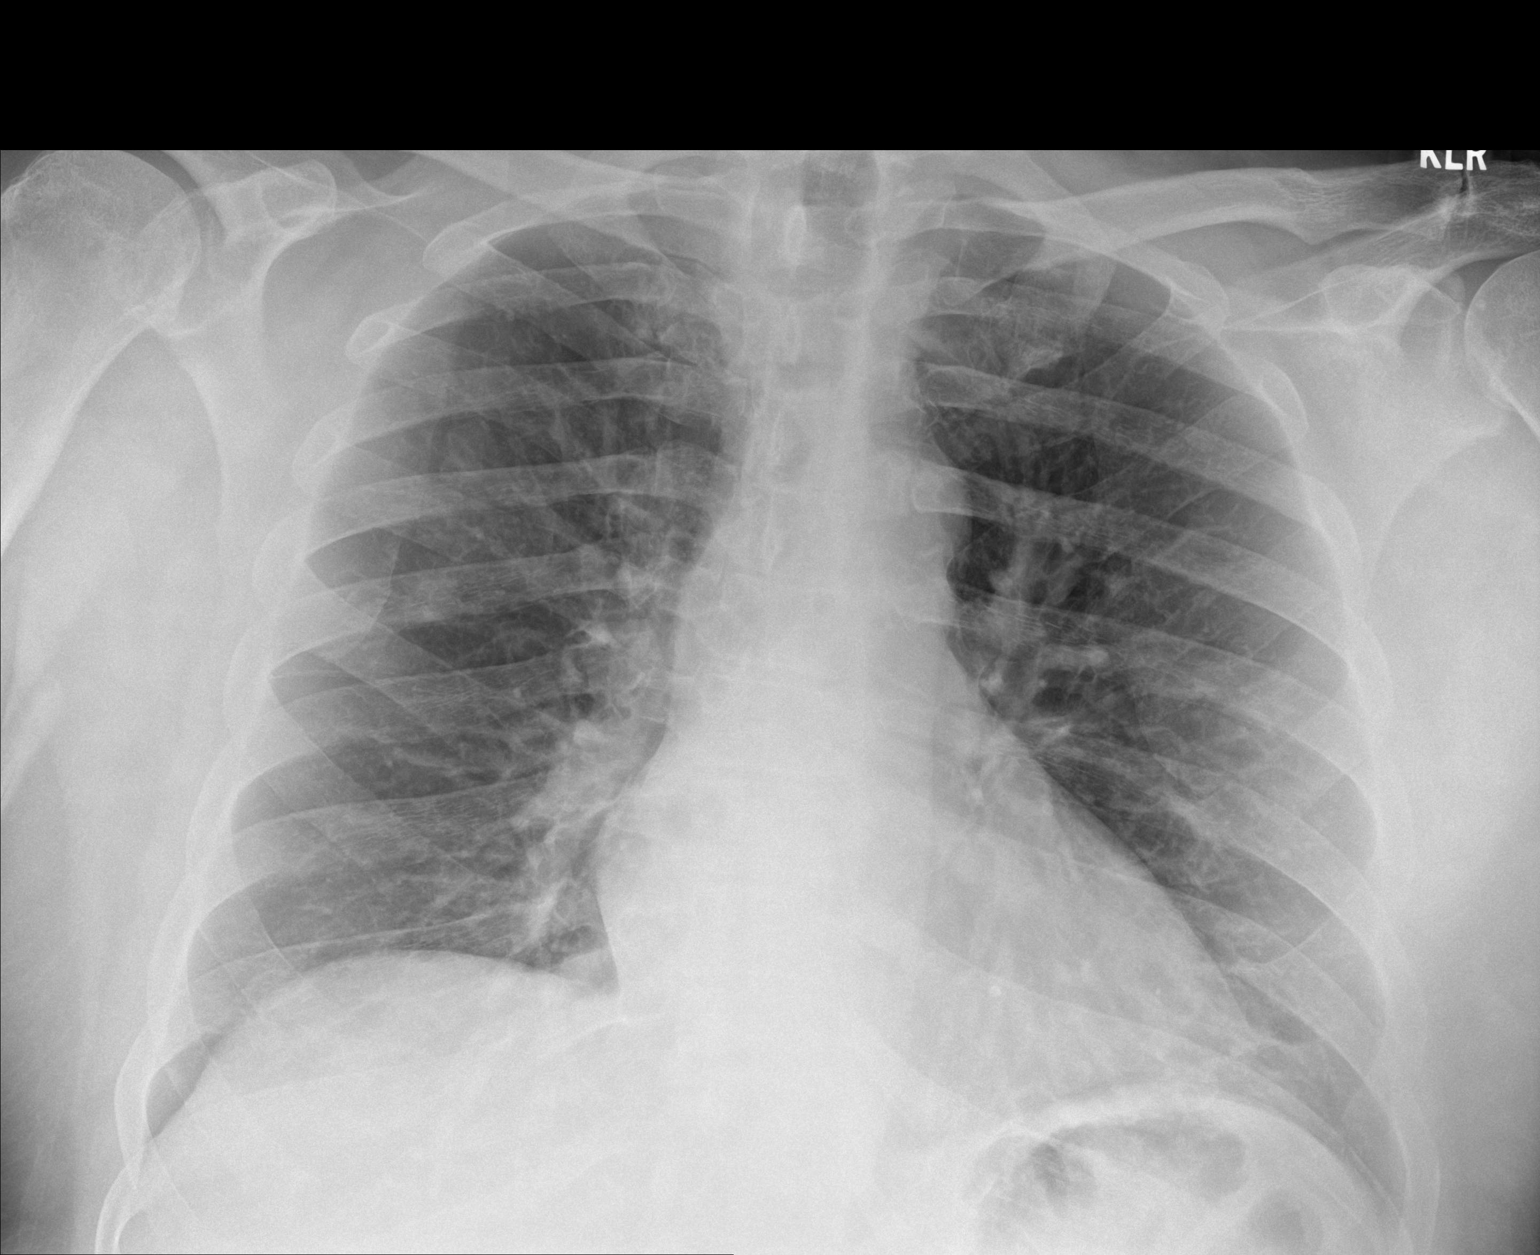

[1 of 1 positions shown; findings below may reference images not displayed]

FINDINGS: There are tiny patchy areas of increased density in both mid zones.
The lungs are otherwise clear. Heart size and vascularity are
normal. No effusions. No significant bone abnormality.
IMPRESSION: Small patchy areas of infiltrate in both mid zones.

## 2024-03-23 NOTE — Progress Notes (Unsigned)
 Referring Physician:  Rex Castor, MD [address] [place],  [place] [address]  Primary Physician:  Rex Castor, MD  History of Present Illness: 03/29/2024 [name] is here today with a chief complaint of low back pain with radiation to his right leg causing right foot weakness.  He states starting 3 months ago he was having right-sided low back pain that went down to his foot.  He feels as though his leg is very heavy and is having more difficulty walking.  Sometimes he feels as though his foot slaps or that he has difficulty pushing off of his foot because he cannot feel it due to the numbness.  The numbness is predominant on the back outside part of his leg and goes into the bottom of his foot and top of his foot.  He has had minimal relief with the steroid.  He continues to take naproxen.  He denies any saddle anesthesia or incontinence of bowel or bladder.       Weakness: none Bowel/Bladder Dysfunction: none  Conservative measures:  Physical therapy: has not participated in PT  Multimodal medical therapy including regular antiinflammatories: Meloxicam, Naproxen  Injections: no epidural steroid injections  Past Surgery: none  Jemario Poitras Buttrey has no symptoms of cervical myelopathy.  The symptoms are causing a significant impact on the patient's life.   Review of Systems:  A 10 point review of systems is negative, except for the pertinent positives and negatives detailed in the HPI.  Past Medical History: Past Medical History:  Diagnosis Date   Pre-diabetes     Past Surgical History: History reviewed. No pertinent surgical history.  Allergies: Allergies as of 03/29/2024   (No Known Allergies)    Medications: Outpatient Encounter Medications as of 03/29/2024  Medication Sig   cyclobenzaprine (FLEXERIL) 5 MG tablet Take 1 tablet (5 mg total) by mouth 3 (three) times daily as needed for muscle spasms.   glipiZIDE (GLUCOTROL XL) 10 MG  24 hr tablet Take 1 tablet by mouth daily.   meloxicam (MOBIC) 15 MG tablet Take by mouth.   pioglitazone (ACTOS) 30 MG tablet Take 30 mg by mouth.   [DISCONTINUED] guaiFENesin -codeine  (ROBITUSSIN AC) 100-10 MG/5ML syrup Take 5 mLs by mouth 3 (three) times daily as needed for cough. (Patient not taking: Reported on 03/29/2024)   [DISCONTINUED] ondansetron  (ZOFRAN  ODT) 8 MG disintegrating tablet Take 1 tablet (8 mg total) by mouth every 8 (eight) hours as needed for nausea or vomiting. (Patient not taking: Reported on 03/29/2024)   [DISCONTINUED] oxyCODONE  (OXY IR/ROXICODONE ) 5 MG immediate release tablet Take by mouth. (Patient not taking: Reported on 03/29/2024)   [DISCONTINUED] predniSONE  (STERAPRED UNI-PAK 21 TAB) 10 MG (21) TBPK tablet Take 6 tablets on the first day and decrease by 1 tablet each day until finished. (Patient not taking: Reported on 03/29/2024)   No facility-administered encounter medications on file as of 03/29/2024.    Social History: Social History   Tobacco Use   Smoking status: Never   Smokeless tobacco: Never  Substance Use Topics   Alcohol use: Not Currently   Drug use: Not Currently    Family Medical History: History reviewed. No pertinent family history.  Physical Examination: @VITALWITHPAIN @  General: Patient is well developed, well nourished, calm, collected, and in no apparent distress. Attention to examination is appropriate.  Psychiatric: Patient is non-anxious.  Head:  Pupils equal, round, and reactive to light.  ENT:  Oral mucosa appears well hydrated.  Neck:   Supple.  Full range of motion.  Respiratory: Patient is breathing without any difficulty.  Extremities: No edema.  Vascular: Palpable dorsal pedal pulses.  Skin:   On exposed skin, there are no abnormal skin lesions.  NEUROLOGICAL:     Awake, alert, oriented to person, place, and time.  Speech is clear and fluent. Fund of knowledge is appropriate.   Cranial Nerves: Pupils equal  round and reactive to light.  Facial tone is symmetric.   ROM of spine: Tenderness to palpation of his lumbar paraspinals.  Positive straight leg raise   Strength:  Side Iliopsoas Quads Hamstring PF DF EHL  R 5 5 5 3 4 4   L 5 5 5 5  4+ 5  Inversion weakness also noted in right lower extremity.  Extreme difficulty to plantarflexion to confrontation.   Numbness on top and bottomm of foot  Reflexes are 2+ at bilateral patella, absent Achilles reflex.  Hoffman's is absent.  Clonus is not present.  Toes are down-going.  Numbness to posterior lateral calf and right lower extremity as well as the dorsum and plantar aspect of his right foot. Patient has difficulty with tandem gait. Medical Decision Making  Imaging: Degenerative changes seen in AP and lateral films of his lumbar spine.  No recent MRI available to review.  Assessment and Plan: [name] is a pleasant 58 y.o. male  is here today with a chief complaint of low back pain with radiation to his right leg causing right foot weakness.  He states starting 3 months ago he was having right-sided low back pain that went down to his foot.  He feels as though his leg is very heavy and is having more difficulty walking.  Sometimes he feels as though his foot slaps or that he has difficulty pushing off of his foot because he cannot feel it due to the numbness.  The numbness is predominant on the back outside part of his leg and goes into the bottom of his foot and top of his foot.  He has had minimal relief with the steroid.  He continues to take naproxen.  On examination, tenderness to palpation of his lumbar paraspinals.Positive straight leg raise.  Weakness in both dorsiflexion and plantarflexion as well as inversion weakness in his right lower extremity.  Some slight dorsiflexion weakness also seen on the left side.  Decreased sensation noticed on dorsum and plantar aspect of his foot.  He also has difficulty with tandem gait.  Was a pleasure  to see patient in clinic today.  Concern for acute radiculopathy with associated weakness.  Would like patient to undergo lumbar MRI soon as possible.  Will review once complete.  Patient also plans to continue his home physical therapy regimen given to him.   Thank you for involving me in the care of this patient.   I spent a total of 45 minutes in both face-to-face and non-face-to-face activities for this visit on the date of this encounter including repairing to the patient, obtaining and reviewing separately obtained history, for medically appropriate examination, counseling the patient, ordering additional tests and procedures as well as medications, documenting.  Ludwig Safer, PA-C Dept. of Neurosurgery

## 2024-03-28 ENCOUNTER — Other Ambulatory Visit: Payer: Self-pay | Admitting: Family Medicine

## 2024-03-28 ENCOUNTER — Inpatient Hospital Stay
Admission: RE | Admit: 2024-03-28 | Discharge: 2024-03-28 | Disposition: A | Discharge status: Home / Self Care | Payer: Self-pay | Source: Ambulatory Visit | Attending: Physician Assistant | Admitting: Physician Assistant

## 2024-03-28 DIAGNOSIS — Z049 Encounter for examination and observation for unspecified reason: Secondary | ICD-10-CM

## 2024-03-29 ENCOUNTER — Encounter: Payer: Self-pay | Admitting: Physician Assistant

## 2024-03-29 ENCOUNTER — Ambulatory Visit (INDEPENDENT_AMBULATORY_CARE_PROVIDER_SITE_OTHER): Admitting: Physician Assistant

## 2024-03-29 VITALS — BP 136/92 | Ht 68.0 in | Wt 257.0 lb

## 2024-03-29 DIAGNOSIS — M545 Low back pain, unspecified: Secondary | ICD-10-CM

## 2024-03-29 DIAGNOSIS — M5416 Radiculopathy, lumbar region: Secondary | ICD-10-CM

## 2024-03-29 MED ORDER — CYCLOBENZAPRINE HCL 5 MG PO TABS: 5.0000 mg | ORAL_TABLET | Freq: Three times a day (TID) | ORAL | 0 refills | Status: DC | PRN

## 2024-04-24 ENCOUNTER — Other Ambulatory Visit: Payer: Self-pay | Admitting: Physician Assistant

## 2024-04-24 ENCOUNTER — Telehealth: Payer: Self-pay | Admitting: Physician Assistant

## 2024-04-24 MED ORDER — TIZANIDINE HCL 4 MG PO TABS: 2.0000 mg | ORAL_TABLET | Freq: Three times a day (TID) | ORAL | 1 refills | Status: DC | PRN

## 2024-04-24 NOTE — Telephone Encounter (Signed)
 Spoke to patient and informed him to pick up Tizanidine. He states he has not received a call to get the MRI scheduled. I told him it has been approved and he can call and get the MRI scheduled. I gave the phone number. He will call.

## 2024-04-24 NOTE — Telephone Encounter (Signed)
 Patient is calling to request something for muscle spasms. He states that his back pain is flaring up again. He states that the Flexeril  made him sleepy and does not want to take this during the day.   [facility]

## 2024-04-24 NOTE — Addendum Note (Signed)
 Addended by: SEBASTIAN ELENOR HERO on: 04/24/2024 11:49 AM   Modules accepted: Orders

## 2024-04-25 ENCOUNTER — Encounter: Payer: Self-pay | Admitting: Physician Assistant

## 2024-04-28 ENCOUNTER — Ambulatory Visit
Admission: RE | Admit: 2024-04-28 | Discharge: 2024-04-28 | Disposition: A | Discharge status: Home / Self Care | Source: Ambulatory Visit | Attending: Physician Assistant | Admitting: Physician Assistant

## 2024-04-28 DIAGNOSIS — M5416 Radiculopathy, lumbar region: Secondary | ICD-10-CM

## 2024-05-08 ENCOUNTER — Ambulatory Visit: Admitting: Physician Assistant

## 2024-05-09 ENCOUNTER — Ambulatory Visit: Admitting: Neurosurgery

## 2024-05-09 NOTE — Progress Notes (Signed)
 Referring Physician:  Sherial Bail, MD [address] [place],  [place] [address]  Primary Physician:  Johnathan Bail, MD  History of Present Illness: 05/10/2024 Patient is here after follow-up with Johnathan Williams.  He has history of a lumbar radiculopathy as well as lumbar stenosis.  He continues to have worsening stenosis.  Unfortunately continues to have difficulty with his blood sugar control.  Currently being managed by his primary care provider.  Plans to start new medication.  Has difficulty ambulating more than 50 yards.   Weakness: none Bowel/Bladder Dysfunction: none  Conservative measures:  Physical therapy: has not participated in PT  Multimodal medical therapy including regular antiinflammatories: Meloxicam, Naproxen  Injections: no epidural steroid injections  Past Surgery: none  Johnathan Williams has no symptoms of cervical myelopathy.  The symptoms are causing a significant impact on the patient's life.   Review of Systems:  A 10 point review of systems is negative, except for the pertinent positives and negatives detailed in the HPI.  Past Medical History: Past Medical History:  Diagnosis Date   Pre-diabetes     Past Surgical History: No past surgical history on file.  Allergies: Allergies as of 05/10/2024   (No Known Allergies)    Medications: Outpatient Encounter Medications as of 05/10/2024  Medication Sig   glipiZIDE (GLUCOTROL XL) 10 MG 24 hr tablet Take 1 tablet by mouth daily.   meloxicam (MOBIC) 15 MG tablet Take by mouth.   pioglitazone (ACTOS) 30 MG tablet Take 30 mg by mouth.   tiZANidine  (ZANAFLEX ) 4 MG tablet Take 0.5-1 tablets (2-4 mg total) by mouth every 8 (eight) hours as needed.   No facility-administered encounter medications on file as of 05/10/2024.    Social History: Social History   Tobacco Use   Smoking status: Never   Smokeless tobacco: Never  Substance Use Topics   Alcohol use: Not Currently   Drug  use: Not Currently    Family Medical History: No family history on file.  Physical Examination:  Strength:  Side Iliopsoas Quads Hamstring PF DF EHL  R 5 5 5 3 4 4   L 5 5 5 5  4+ 5  Inversion weakness also noted in right lower extremity.  Extreme difficulty to plantarflexion to confrontation.   Numbness on top and bottomm of foot  Reflexes are 2+ at bilateral patella, absent Achilles reflex.  Hoffman's is absent.  Clonus is not present.  Toes are down-going.  Numbness to posterior lateral calf and right lower extremity as well as the dorsum and plantar aspect of his right foot. Patient has difficulty with tandem gait. Medical Decision Making  Imaging: Degenerative changes seen in AP and lateral films of his lumbar spine.  No recent MRI available to review.  Assessment and Plan: Johnathan Williams is a pleasant 58 y.o. male  is here today with a chief complaint of low back pain with radiation to his right leg causing right foot weakness.  He states starting 4 months ago he was having right-sided low back pain that went down to his foot.  Likely some radicular pain secondary to his disc herniation.  He also has a history of heel surgery which is caused some difficulty with plantarflexion.  He does have symptoms of claudication.  Unfortunately his A1c is quite elevated, working with his medical doctor to increase/titrate his medications to better control.  Will continue to follow, he will likely require a lumbar decompression.  Will plan on flexion-extension x-rays to evaluate for any  need for fusion.  Should he not have any instability we will likely discuss a lumbar decompression at his next follow-up.  We look forward to hearing back after he has had follow-up with his PCP to discuss his blood sugar control.  Johnathan MICAEL Sharps, MD d ept. of Neurosurgery

## 2024-05-10 ENCOUNTER — Ambulatory Visit: Admitting: Neurosurgery

## 2024-05-10 ENCOUNTER — Encounter: Payer: Self-pay | Admitting: Neurosurgery

## 2024-05-10 VITALS — BP 132/82 | Ht 68.0 in | Wt 258.8 lb

## 2024-05-10 DIAGNOSIS — M5416 Radiculopathy, lumbar region: Secondary | ICD-10-CM | POA: Diagnosis not present

## 2024-05-10 DIAGNOSIS — M48062 Spinal stenosis, lumbar region with neurogenic claudication: Secondary | ICD-10-CM | POA: Diagnosis not present

## 2024-06-06 ENCOUNTER — Ambulatory Visit: Admitting: Neurosurgery

## 2024-06-06 VITALS — BP 130/82 | Ht 68.0 in | Wt 258.0 lb

## 2024-06-06 DIAGNOSIS — M48061 Spinal stenosis, lumbar region without neurogenic claudication: Secondary | ICD-10-CM | POA: Insufficient documentation

## 2024-06-06 DIAGNOSIS — E1165 Type 2 diabetes mellitus with hyperglycemia: Secondary | ICD-10-CM | POA: Diagnosis not present

## 2024-06-06 DIAGNOSIS — Z7984 Long term (current) use of oral hypoglycemic drugs: Secondary | ICD-10-CM | POA: Diagnosis not present

## 2024-06-06 DIAGNOSIS — M48062 Spinal stenosis, lumbar region with neurogenic claudication: Secondary | ICD-10-CM | POA: Diagnosis not present

## 2024-06-06 DIAGNOSIS — M5416 Radiculopathy, lumbar region: Secondary | ICD-10-CM | POA: Insufficient documentation

## 2024-06-06 NOTE — Progress Notes (Signed)
 Referring Physician:  Sherial Bail, MD [address] [place],  [place] [address]  Primary Physician:  Sherial Bail, MD  History of Present Illness: 06/06/2024 Mr. Starner 58 year old man with a history of lumbar stenosis and neurogenic claudication.  He continues to follow with us .  When I last saw him he was having difficulty with his glucose control.  His history of diabetes.  He was previously on medicine and was planning to start on metformin.  He continues to have heaviness in his legs.  He states that this is worse when he ambulates.  It often affects him more in the mornings.  He often feels himself leaning forward or sitting in a tripod position.  The aching gets worse the farther he goes.  He feels like he fatigues faster and his energy overall is down.  It is making it difficult for him to perform his activities of daily living.  He is not able to walk as far as he used to be.  He is not having any saddle anesthesia.  He gets pain numbness tingling going down his back to his bilateral legs.  He sometimes will feel his foot slapping, he does have a history of a right sided ankle trauma which has caused intermittent problem with his ambulation before but he states that this is different and been progressive.  Weakness: none Bowel/Bladder Dysfunction: none  Conservative measures:  Physical therapy: has not participated in PT  Multimodal medical therapy including regular antiinflammatories: Meloxicam, Naproxen  Injections: no epidural steroid injections  Past Surgery: none  The symptoms are causing a significant impact on the patient's life.   Review of Systems:  A 10 point review of systems is negative, except for the pertinent positives and negatives detailed in the HPI.  Past Medical History: Past Medical History:  Diagnosis Date   Pre-diabetes     Past Surgical History: No past surgical history on file.  Allergies: Allergies as of 06/06/2024    (No Known Allergies)    Medications: Outpatient Encounter Medications as of 06/06/2024  Medication Sig   glipiZIDE (GLUCOTROL XL) 10 MG 24 hr tablet Take 1 tablet by mouth daily.   meloxicam (MOBIC) 15 MG tablet Take by mouth.   metFORMIN (GLUCOPHAGE) 1000 MG tablet Take 1,000 mg by mouth 2 (two) times daily with a meal.   pioglitazone (ACTOS) 30 MG tablet Take 30 mg by mouth.   tiZANidine  (ZANAFLEX ) 4 MG tablet Take 0.5-1 tablets (2-4 mg total) by mouth every 8 (eight) hours as needed.   No facility-administered encounter medications on file as of 06/06/2024.    Social History: Social History   Tobacco Use   Smoking status: Never   Smokeless tobacco: Never  Substance Use Topics   Alcohol use: Not Currently   Drug use: Not Currently    Family Medical History: No family history on file.  Physical Examination:   NEUROLOGICAL:     Awake, alert, oriented to person, place, and time.  Speech is clear and fluent. Fund of knowledge is appropriate.   Cranial Nerves: Pupils equal round and reactive to light.  Facial tone is symmetric.   ROM of spine: Tenderness to palpation of his lumbar paraspinals.  Positive straight leg raise   Strength:  Side Iliopsoas Quads Hamstring PF DF EHL  R 5 5 5 4  but pain limited due to previous surgery 4+ +4  L 5 5 5 5  4+ 5   Reflexes are 1+ at bilateral patella, absent Achilles reflex.  Hoffman's is absent.  Clonus is not present.  Toes are down-going.  Numbness to posterior lateral calf and right lower extremity as well as the dorsum and plantar aspect of his right foot. Patient has difficulty with tandem gait. Medical Decision Making  Imaging: Narrative & Impression  CLINICAL DATA:  Lumbar radiculopathy, symptoms persist with > 6 wks treatment. Low back pain. Bilateral foot numbness.   EXAM: MRI LUMBAR SPINE WITHOUT CONTRAST   TECHNIQUE: Multiplanar, multisequence MR imaging of the lumbar spine was performed. No intravenous contrast  was administered.   COMPARISON:  Lumbar spine MRI 04/08/2014   FINDINGS: Segmentation:  Standard.   Alignment:  Normal.   Vertebrae: No fracture or suspicious marrow lesion. Progressive degenerative endplate changes at L5-S1 including mild to moderate Modic type 1 changes/edema. Multiple Schmorl's nodes in the upper lumbar and lower thoracic spine, mildly progressed from prior (for example a new left paracentral L1 inferior endplate Schmorl's node).   Conus medullaris and cauda equina: Conus extends to the L1 level. Conus and cauda equina appear normal.   Paraspinal and other soft tissues: Unremarkable.   Disc levels:   Disc desiccation throughout the lumbar spine with exception of L2-3. Progressive, severe disc space narrowing at L5-S1 with up to mild narrowing elsewhere. Diffuse congenital narrowing of the lumbar spinal canal due to short pedicles.   T12-L1: Disc bulging and a broad posterior disc protrusion result in mild spinal stenosis, progressed from prior. Patent neural foramina.   L1-2: Disc bulging and mild facet hypertrophy result in mild spinal stenosis without neural foraminal stenosis, similar to prior.   L2-3: Disc bulging and moderate facet and ligamentum flavum hypertrophy result in mild-to-moderate spinal stenosis, mildly progressed. Patent neural foramina.   L3-4: Disc bulging, a new small to moderate-sized right paracentral to subarticular disc extrusion with mild cephalad migration, and moderate facet and ligamentum flavum hypertrophy result in increased, moderate to severe spinal stenosis, moderate to severe right and mild-to-moderate left lateral recess stenosis, and mild bilateral neural foraminal stenosis. Potential right L4 nerve root impingement.   L4-5: Circumferential disc bulging eccentric to the left and severe facet and ligamentum flavum hypertrophy result in severe spinal stenosis and moderate left neural foraminal stenosis,  mildly progressed. A more focal right paracentral to subarticular disc protrusion on the prior MRI has regressed with mildly improved right lateral recess patency.   L5-S1: Circumferential disc bulging, a chronic left paracentral disc protrusion, a new right paracentral disc extrusion with cephalad migration to the mid L5 vertebral body level, and moderate facet and ligamentum flavum hypertrophy result in increased, severe spinal stenosis, severe bilateral lateral recess stenosis, and severe right and moderate left neural foraminal stenosis. Potential right L5 and bilateral S1 nerve root impingement.   IMPRESSION: 1. Progressive lumbar disc and facet degeneration with severe spinal stenosis at L4-5 and L5-S1 and moderate to severe spinal stenosis at L3-4. New disc extrusions at L3-4 and L5-S1. 2. Severe right and moderate left neural foraminal stenosis at L5-S1. 3. Moderate left neural foraminal stenosis at L4-5.     Electronically Signed   By: Dasie Hamburg M.D.   On: 05/03/2024 11:47     Assessment and Plan: Mr. Wendorff is a pleasant 58 y.o. male  is here today with a chief complaint of low back pain with radiation to his right leg causing right foot weakness.  He initially had an acute exacerbation of right lower extremity pain.  Started in his buttocks, down his back of his leg.  This was approximately 5 months ago.  He still having some of these issues but his biggest complaint is his claudicatory in nature.  States that over the past few years he has had worsening symptoms of claudication.  When he walks he is no longer able to ambulate considerable distances.  He has to sit down and lean forward to get any relief to his legs.  He has difficulty standing for prolonged periods.  His pulses are palpable and both the DPs and PTs.  His MRI demonstrates severe spinal stenosis from L3-S1 from a mixture of facet hypertrophy, ligamentum hypertrophy, and disc degeneration.  On the right  side at the L5-S1 interval there is a disc herniation, this is likely the cause of his previous radiculopathy which was his most highest level of pain, however his most impactful issue causing his decrease in ADLs is his central lumbar stenosis with claudicatory symptoms.  He has had worsening ability to walk long distances due to the claudication, it is impacting his quality of life, and is also impacting his ability to work and ambulate around.  He has not had formal physical therapy, however he has had structured home exercises for greater than 8 weeks months, full he has been dealing with these issues and using conservative measures including oral analgesics for the past 5 to 6 months, conservative observational care including analgesics such as prescription NSAIDs and muscle relaxants.  For the past 5 months.  His last injections in the lumbar spine were multiple years ago.    He has had improved diabetic control, was recently placed on metformin.  At this time we will plan for a fructosamine test and follow-up, we will plan for at least 6 weeks of good sugar control prior to operation to optimize his infection disease risks.  Risk and benefits were discussed including but not limited to continued pain, continued numbness and tingling, incomplete resolution of symptoms, destabilization of his spine, infection, CSF leak, nerve injury  He would like to go forward with surgery.  He plans to continue his current diabetic regimen.  [name], MD

## 2024-06-06 NOTE — Progress Notes (Signed)
 Claudene Penne ORN, MD  Alcee Sipos, RN Neurosurgery Booking Request - Dr. Penne Claudene  Patient Name: Norvell Caswell Sanabia DOB: August 21, 1966 MRN: [id number] Sex: male Phone Number(s): [phone] (home) Interpreter Needed: No Language:  Surgeon: Penne Claudene, MD Assist: Per Protocol Preop Diagnosis: [id number], Requested Surgery Date/Time: Per KJ based off of fructosamine Surgery Location: [facility] Patient Class: Outpatient with Overnight Anesthesia Type: General Requested Length of Surgery: 2hrs Laterality: midline Positioning: Prone Surgical Procedure: L3-S1 Laminectomy, L5-S1 Microdiscecotmy Special Case Needs (equipment, implants, sets): Mcullough, carm, scope Brainlab: no Pathology Needed: no Rep Needed: no Rep/Company Name: n/a Indication for Consent: Severe Lumbar Stenosis with Claudication CPT Codes: 36952, 36951, [id number] Monitoring Required: NA Brace Required: NA Type of Brace: NA Order Faxed to [facility]: NA

## 2024-06-06 NOTE — Patient Instructions (Signed)
 Please see below for information in regards to your upcoming surgery:   Fructosamine test in 3 weeks (around/after 06/27/24)    Planned surgery: L3-S1 laminectomy, L5-S1 microdiscectomy   Surgery date: 07/26/24 at [facility] (Medical Mall: [address], [place], [place] [address]) - you will find out your arrival time the business day before your surgery.   Pre-op appointment at [facility] Pre-admit Testing: you will receive a call with a date/time for this appointment. If you are scheduled for an in person appointment, Pre-admit Testing is located on the first floor of the [facility] building, [address], [address]. During this appointment, they will advise you which medications you can take the morning of surgery, and which medications you will need to hold for surgery. Labs (such as blood work, EKG) may be done at your pre-op appointment. You are not required to fast for these labs. Should you need to change your pre-op appointment, please call Pre-admit testing at [phone].      Diabetes/heart failure/kidney disease/weight loss medications that require an extended hold: Per anesthesia guidelines (due to the increased risk of aspiration caused by delayed gastric emptying):  Metformin: hold for 2 days prior to surgery      Common restrictions after spine surgery: No bending, lifting, or twisting ("BLT"). Avoid lifting objects heavier than 10 pounds for the first 6 weeks after surgery. Where possible, avoid household activities that involve lifting, bending, reaching, pushing, or pulling such as laundry, vacuuming, grocery shopping, and childcare. Try to arrange for help from friends and family for these activities while you heal. Do not drive while taking prescription pain medication. Weeks 6 through 12 after surgery: avoid lifting more than 25 pounds.     How to contact us :  If you have any questions/concerns before or after  surgery, you can reach us  at [phone], or you can send a mychart message. We can be reached by phone or mychart 8am-4pm, Monday-Friday.  *Please note: Calls after 4pm are forwarded to a third party answering service. Mychart messages are not routinely monitored during evenings, weekends, and holidays. Please call our office to contact the answering service for urgent concerns during non-business hours.    If you have FMLA/disability paperwork, please drop it off or fax it to [phone]   Appointments/FMLA & disability paperwork: Reche [employer] Registered Nurse/Surgery scheduler: Mellie Buccellato, RN Certified Medical Assistants: Don, CMA, Elenor, CMA, & Damien, CMA Physician Assistants: Lyle Decamp, PA-C, Edsel Goods, PA-C & Glade Boys, PA-C Surgeons: Penne Sharps, MD & Reeves Daisy, MD   [facility] PREADMIT TESTING VISIT and SURGERY INFORMATION SHEET   Now that surgery has been scheduled you can anticipate several phone calls from [facility] services. A pharmacy technician will call you to verify your current list of medications taken at home.               The Pre-Service Center will call to verify your insurance information and to give you billing estimates and information.             The Preadmit Testing Office will be calling to schedule a visit to obtain information for the anesthesia team and provide instructions on preparation for surgery.  What can you expect for the Preadmit Testing Visit: Appointments may be scheduled in-person or by telephone.  If a telephone visit is scheduled, you may be asked to come into the office to have lab tests or other studies performed.  This visit will not be completed any greater than 14 days prior to your surgery.  If your surgery has been scheduled for a future date, please do not be alarmed if we have not contacted you to schedule an appointment more than a month prior to the surgery date.    Please be  prepared to provide the following information during this appointment:            -Personal medical history                                               -Medication and allergy list            -Any history of problems with anesthesia              -Recent lab work or diagnostic studies            -Please notify us  of any needs we should be aware of to provide the best care possible           -You will be provided with instructions on how to prepare for your surgery.    On The Day of Surgery:  You must have a driver to take you home after surgery, you will be asked not to drive for 24 hours following surgery.  Taxi, Gisele and non-medical transport will not be acceptable means of transportation unless you have a responsible individual who will be traveling with you.  Visitors in the surgical area:   2 people will be able to visit you in your room once your preparation for surgery has been completed. During surgery, your visitors will be asked to wait in the Surgery Waiting Area.  It is not a requirement for them to stay, if they prefer to leave and come back.  Your visitor(s) will be given an update once the surgery has been completed.  No visitors are allowed in the initial recovery room to respect patient privacy and safety.  Once you are more awake and transfer to the secondary recovery area, or are transferred to an inpatient room, visitors will again be able to see you.  To respect and protect your privacy: We will ask on the day of surgery who your driver will be and what the contact number for that individual will be. We will ask if it is okay to share information with this individual, or if there is an alternative individual that we, or the surgeon, should contact to provide updates and information. If family or friends come to the surgical information desk requesting information about you, who you have not listed with us , no information will be given.   It may be helpful to designate  someone as the main contact who will be responsible for updating your other friends and family.    PREADMIT TESTING OFFICE: [phone] SAME DAY SURGERY: [phone] We look forward to caring for you before and throughout the process of your surgery.

## 2024-06-13 ENCOUNTER — Other Ambulatory Visit: Payer: Self-pay

## 2024-06-13 DIAGNOSIS — M48062 Spinal stenosis, lumbar region with neurogenic claudication: Secondary | ICD-10-CM

## 2024-06-13 DIAGNOSIS — Z01818 Encounter for other preprocedural examination: Secondary | ICD-10-CM

## 2024-06-28 ENCOUNTER — Telehealth: Payer: Self-pay

## 2024-06-28 NOTE — Telephone Encounter (Signed)
 I spoke with Johnathan Williams. He was concerned that it was too soon to go to the lab for the fructosamine test. I discussed that Dr Claudene had requested that he get the fructosamine test at the end of August and that we would like to make sure that his blood sugars are headed in the right direction prior to surgery to minimize his infection risk. I explained what a fructosamine test is and how it works. I informed him that he can have this done through Dr Sherial if she is willing to order it, or he can have this done at the lab at [facility] (the test is already ordered in the [facility] system). He states the highest blood sugar he has seen since he saw us  on 06/06/24 is 150, but they have typically been lower that than. I commended him on this.  I reminded him that he will need to stop his metformin 2 days prior to surgery. I also reminded him of his PAT appointment on 07/19/24.  He states he will plan to go to the [facility] lab on 07/03/24.

## 2024-06-28 NOTE — Telephone Encounter (Signed)
 Johnathan Williams was supposed to go to the lab at [facility] for a fructosamine test on 06/27/24. It does not appear that he has had this done.

## 2024-06-28 NOTE — Telephone Encounter (Signed)
 Patient advised but was questioning the timeline for this test and thought it should be done closer to the surgery not a month out and that he thought he can get it done through his PCP office with Dr Mabeline their lab at [facility]. I advised of the message as stated below and where to go.  Patient said he understood but still asked for Kendelyn to call him back

## 2024-07-03 NOTE — Telephone Encounter (Signed)
 Johnathan Williams did not go to the lab today for his fructosamine test. I will call him again later this week if he does not have it done.

## 2024-07-04 ENCOUNTER — Other Ambulatory Visit
Admission: RE | Admit: 2024-07-04 | Discharge: 2024-07-04 | Disposition: A | Discharge status: Home / Self Care | Source: Ambulatory Visit | Attending: Neurosurgery | Admitting: Neurosurgery

## 2024-07-04 DIAGNOSIS — E1165 Type 2 diabetes mellitus with hyperglycemia: Secondary | ICD-10-CM | POA: Diagnosis present

## 2024-07-05 NOTE — Telephone Encounter (Signed)
 Fructosamine test is in process

## 2024-07-06 ENCOUNTER — Ambulatory Visit: Payer: Self-pay

## 2024-07-06 LAB — FRUCTOSAMINE: Fructosamine: 271 umol/L (ref 0–285)

## 2024-07-09 NOTE — Telephone Encounter (Signed)
 Fructosamine test has resulted and is appropriate to proceed with surgery as planned. Mychart message sent to patient.

## 2024-07-12 NOTE — Telephone Encounter (Signed)
 Patient has not read mychart message that I sent on 07/06/24. Left message for pt to return call if he is not able to check his mychart.

## 2024-07-19 ENCOUNTER — Other Ambulatory Visit: Payer: Self-pay

## 2024-07-19 ENCOUNTER — Encounter
Admission: RE | Admit: 2024-07-19 | Discharge: 2024-07-19 | Disposition: A | Discharge status: Home / Self Care | Source: Ambulatory Visit | Attending: Neurosurgery | Admitting: Neurosurgery

## 2024-07-19 VITALS — BP 128/84 | HR 72 | Resp 14 | Ht 68.0 in | Wt 255.6 lb

## 2024-07-19 DIAGNOSIS — Z01812 Encounter for preprocedural laboratory examination: Secondary | ICD-10-CM

## 2024-07-19 DIAGNOSIS — I10 Essential (primary) hypertension: Secondary | ICD-10-CM | POA: Diagnosis not present

## 2024-07-19 DIAGNOSIS — E119 Type 2 diabetes mellitus without complications: Secondary | ICD-10-CM | POA: Insufficient documentation

## 2024-07-19 DIAGNOSIS — Z01818 Encounter for other preprocedural examination: Secondary | ICD-10-CM | POA: Insufficient documentation

## 2024-07-19 DIAGNOSIS — M48062 Spinal stenosis, lumbar region with neurogenic claudication: Secondary | ICD-10-CM | POA: Insufficient documentation

## 2024-07-19 DIAGNOSIS — Z01810 Encounter for preprocedural cardiovascular examination: Secondary | ICD-10-CM

## 2024-07-19 HISTORY — DX: Personal history of other diseases of the digestive system: Z87.19

## 2024-07-19 HISTORY — DX: Type 2 diabetes mellitus without complications: E11.9

## 2024-07-19 HISTORY — DX: Personal history of other diseases of the nervous system and sense organs: Z86.69

## 2024-07-19 HISTORY — DX: Sleep apnea, unspecified: G47.30

## 2024-07-19 HISTORY — DX: Spinal stenosis, lumbar region with neurogenic claudication: M48.062

## 2024-07-19 HISTORY — DX: Essential (primary) hypertension: I10

## 2024-07-19 LAB — URINALYSIS, COMPLETE (UACMP) WITH MICROSCOPIC
Bacteria, UA: NONE SEEN
Bilirubin Urine: NEGATIVE
Glucose, UA: NEGATIVE mg/dL
Hgb urine dipstick: NEGATIVE
Ketones, ur: 5 mg/dL — AB
Leukocytes,Ua: NEGATIVE
Nitrite: NEGATIVE
Protein, ur: NEGATIVE mg/dL
Specific Gravity, Urine: 1.021 (ref 1.005–1.030)
Squamous Epithelial / HPF: 0 /HPF (ref 0–5)
pH: 5 (ref 5.0–8.0)

## 2024-07-19 LAB — TYPE AND SCREEN
ABO/RH(D): A POS
Antibody Screen: NEGATIVE

## 2024-07-19 LAB — CBC
HCT: 40.9 % (ref 39.0–52.0)
Hemoglobin: 13.7 g/dL (ref 13.0–17.0)
MCH: 27.9 pg (ref 26.0–34.0)
MCHC: 33.5 g/dL (ref 30.0–36.0)
MCV: 83.3 fL (ref 80.0–100.0)
Platelets: 302 K/uL (ref 150–400)
RBC: 4.91 MIL/uL (ref 4.22–5.81)
RDW: 13.6 % (ref 11.5–15.5)
WBC: 5.4 K/uL (ref 4.0–10.5)
nRBC: 0 % (ref 0.0–0.2)

## 2024-07-19 LAB — SURGICAL PCR SCREEN
MRSA, PCR: NEGATIVE
Staphylococcus aureus: POSITIVE — AB

## 2024-07-19 NOTE — Patient Instructions (Addendum)
 Your procedure is scheduled on:07-26-24 Thursday Report to the Registration Desk on the 1st floor of the Medical Mall.Then proceed to the 2nd floor Surgery Desk To find out your arrival time, please call [phone] between 1PM - 3PM on:07-25-24 Wednesday If your arrival time is 6:00 am, do not arrive before that time as the Medical Mall entrance doors do not open until 6:00 am.  REMEMBER: Instructions that are not followed completely may result in serious medical risk, up to and including death; or upon the discretion of your surgeon and anesthesiologist your surgery may need to be rescheduled.  Do not eat food after midnight the night before surgery.  No gum chewing or hard candies.  You may however, drink Water up to 2 hours before you are scheduled to arrive for your surgery. Do not drink anything within 2 hours of your scheduled arrival time.  One week prior to surgery:Stop NOW (07-19-24) Stop ANY OVER THE COUNTER supplements until after surgery.  Continue taking all of your other prescription medications up until the day of surgery.  Stop metFORMIN (GLUCOPHAGE) 2 days prior to surgery-Lasts dose will be on 07-23-24 Monday  Do NOT take any medication the day of surgery  No Alcohol for 24 hours before or after surgery.  No Smoking including e-cigarettes for 24 hours before surgery.  No chewable tobacco products for at least 6 hours before surgery.  No nicotine patches on the day of surgery.  Do not use any recreational drugs for at least a week (preferably 2 weeks) before your surgery.  Please be advised that the combination of cocaine and anesthesia may have negative outcomes, up to and including death. If you test positive for cocaine, your surgery will be cancelled.  On the morning of surgery brush your teeth with toothpaste and water, you may rinse your mouth with mouthwash if you wish. Do not swallow any toothpaste or mouthwash.  Use CHG Soap as directed on instruction  sheet.  Do not wear jewelry, make-up, hairpins, clips or nail polish.  For welded (permanent) jewelry: bracelets, anklets, waist bands, etc.  Please have this removed prior to surgery.  If it is not removed, there is a chance that hospital personnel will need to cut it off on the day of surgery.  Do not wear lotions, powders, or perfumes.   Do not shave body hair from the neck down 48 hours before surgery.  Contact lenses, hearing aids and dentures may not be worn into surgery.  Do not bring valuables to the hospital. [facility] is not responsible for any missing/lost belongings or valuables.   Notify your doctor if there is any change in your medical condition (cold, fever, infection).  Wear comfortable clothing (specific to your surgery type) to the hospital.  After surgery, you can help prevent lung complications by doing breathing exercises.  Take deep breaths and cough every 1-2 hours. Your doctor may order a device called an Incentive Spirometer to help you take deep breaths. When coughing or sneezing, hold a pillow firmly against your incision with both hands. This is called "splinting." Doing this helps protect your incision. It also decreases belly discomfort.  If you are being admitted to the hospital overnight, leave your suitcase in the car. After surgery it may be brought to your room.  In case of increased patient census, it may be necessary for you, the patient, to continue your postoperative care in the Same Day Surgery department.  If you are being discharged the  day of surgery, you will not be allowed to drive home. You will need a responsible individual to drive you home and stay with you for 24 hours after surgery.   If you are taking public transportation, you will need to have a responsible individual with you.  Please call the Pre-admissions Testing Dept. at [phone] if you have any questions about these instructions.  Surgery Visitation  Policy:  Patients having surgery or a procedure may have two visitors.  Children under the age of 35 must have an adult with them who is not the patient.  Inpatient Visitation:    Visiting hours are 7 a.m. to 8 p.m. Up to four visitors are allowed at one time in a patient room. The visitors may rotate out with other people during the day.  One visitor age 15 or older may stay with the patient overnight and must be in the room by 8 p.m.    Pre-operative 5 CHG Bath Instructions   You can play a key role in reducing the risk of infection after surgery. Your skin needs to be as free of germs as possible. You can reduce the number of germs on your skin by washing with CHG (chlorhexidine gluconate) soap before surgery. CHG is an antiseptic soap that kills germs and continues to kill germs even after washing.   DO NOT use if you have an allergy to chlorhexidine/CHG or antibacterial soaps. If your skin becomes reddened or irritated, stop using the CHG and notify one of our RNs at [phone].   Please shower with the CHG soap starting 4 days before surgery using the following schedule:     Please keep in mind the following:  DO NOT shave, including legs and underarms, starting the day of your first shower.   You may shave your face at any point before/day of surgery.  Place clean sheets on your bed the day you start using CHG soap. Use a clean washcloth (not used since being washed) for each shower. DO NOT sleep with pets once you start using the CHG.   CHG Shower Instructions:  If you choose to wash your hair and private area, wash first with your normal shampoo/soap.  After you use shampoo/soap, rinse your hair and body thoroughly to remove shampoo/soap residue.  Turn the water OFF and apply about 3 tablespoons (45 ml) of CHG soap to a CLEAN washcloth.  Apply CHG soap ONLY FROM YOUR NECK DOWN TO YOUR TOES (washing for 3-5 minutes)  DO NOT use CHG soap on face, private areas, open  wounds, or sores.  Pay special attention to the area where your surgery is being performed.  If you are having back surgery, having someone wash your back for you may be helpful. Wait 2 minutes after CHG soap is applied, then you may rinse off the CHG soap.  Pat dry with a clean towel  Put on clean clothes/pajamas   If you choose to wear lotion, please use ONLY the CHG-compatible lotions on the back of this paper.     Additional instructions for the day of surgery: DO NOT APPLY any lotions, deodorants, cologne, or perfumes.   Put on clean/comfortable clothes.  Brush your teeth.  Ask your nurse before applying any prescription medications to the skin.      CHG Compatible Lotions   Aveeno Moisturizing lotion  Cetaphil Moisturizing Cream  Cetaphil Moisturizing Lotion  Clairol Herbal Essence Moisturizing Lotion, Dry Skin  Clairol Herbal Essence Moisturizing Lotion, Extra Dry  Skin  Clairol Herbal Essence Moisturizing Lotion, Normal Skin  Curel Age Defying Therapeutic Moisturizing Lotion with Alpha Hydroxy  Curel Extreme Care Body Lotion  Curel Soothing Hands Moisturizing Hand Lotion  Curel Therapeutic Moisturizing Cream, Fragrance-Free  Curel Therapeutic Moisturizing Lotion, Fragrance-Free  Curel Therapeutic Moisturizing Lotion, Original Formula  Eucerin Daily Replenishing Lotion  Eucerin Dry Skin Therapy Plus Alpha Hydroxy Crme  Eucerin Dry Skin Therapy Plus Alpha Hydroxy Lotion  Eucerin Original Crme  Eucerin Original Lotion  Eucerin Plus Crme Eucerin Plus Lotion  Eucerin TriLipid Replenishing Lotion  Keri Anti-Bacterial Hand Lotion  Keri Deep Conditioning Original Lotion Dry Skin Formula Softly Scented  Keri Deep Conditioning Original Lotion, Fragrance Free Sensitive Skin Formula  Keri Lotion Fast Absorbing Fragrance Free Sensitive Skin Formula  Keri Lotion Fast Absorbing Softly Scented Dry Skin Formula  Keri Original Lotion  Keri Skin Renewal Lotion Keri Silky Smooth  Lotion  Keri Silky Smooth Sensitive Skin Lotion  Nivea Body Creamy Conditioning Oil  Nivea Body Extra Enriched Teacher, adult education Moisturizing Lotion Nivea Crme  Nivea Skin Firming Lotion  NutraDerm 30 Skin Lotion  NutraDerm Skin Lotion  NutraDerm Therapeutic Skin Cream  NutraDerm Therapeutic Skin Lotion  ProShield Protective Hand Cream  Provon moisturizing lotion   Merchandiser, retail to address health-related social needs:  [email or web address]

## 2024-07-23 ENCOUNTER — Telehealth: Payer: Self-pay | Admitting: Neurosurgery

## 2024-07-23 NOTE — Telephone Encounter (Signed)
 Pt returned your call on the answering service.

## 2024-07-23 NOTE — Telephone Encounter (Addendum)
 Tried calling him on both #'s. Left message to return call.

## 2024-07-23 NOTE — Telephone Encounter (Signed)
 Left message to return call

## 2024-07-23 NOTE — Telephone Encounter (Signed)
 Patient is requesting a call back regarding medications prior to his surgery. He has been taking OTC medications such as Zyrtec and Advil. He also states that his ketones are elevated.

## 2024-07-24 NOTE — Telephone Encounter (Signed)
 Patient called back and is needing to know as soon as possible if he needs to take this medication today, and also in regard to the ketones being elevated.

## 2024-07-24 NOTE — Telephone Encounter (Signed)
 I spoke with Johnathan Williams.  He wanted confirmation about stopping his metformin and taking/holding his medications for surgery. He inquired about ketones on his urinalysis. He confirmed that he was fasting at the time of his lab work. I informed him that this could be the cause of the ketones, and that Dorise Pereyra, NP with pre-admit testing reviewed this and did not feel it was a cause for concern.

## 2024-07-26 ENCOUNTER — Ambulatory Visit: Admitting: Anesthesiology

## 2024-07-26 ENCOUNTER — Ambulatory Visit: Payer: Self-pay | Admitting: Urgent Care

## 2024-07-26 ENCOUNTER — Other Ambulatory Visit: Payer: Self-pay

## 2024-07-26 ENCOUNTER — Other Ambulatory Visit: Payer: Self-pay | Admitting: Neurosurgery

## 2024-07-26 ENCOUNTER — Encounter
Admission: RE | Disposition: A | Discharge status: Home / Self Care | Payer: Self-pay | Source: Home / Self Care | Attending: Neurosurgery

## 2024-07-26 ENCOUNTER — Telehealth: Payer: Self-pay

## 2024-07-26 ENCOUNTER — Encounter: Payer: Self-pay | Admitting: Neurosurgery

## 2024-07-26 ENCOUNTER — Ambulatory Visit
Admission: RE | Admit: 2024-07-26 | Discharge: 2024-07-26 | Disposition: A | Discharge status: Home / Self Care | Attending: Neurosurgery | Admitting: Neurosurgery

## 2024-07-26 DIAGNOSIS — M48062 Spinal stenosis, lumbar region with neurogenic claudication: Secondary | ICD-10-CM

## 2024-07-26 DIAGNOSIS — G4730 Sleep apnea, unspecified: Secondary | ICD-10-CM | POA: Diagnosis not present

## 2024-07-26 DIAGNOSIS — I10 Essential (primary) hypertension: Secondary | ICD-10-CM | POA: Insufficient documentation

## 2024-07-26 DIAGNOSIS — Z01818 Encounter for other preprocedural examination: Secondary | ICD-10-CM

## 2024-07-26 DIAGNOSIS — Z5329 Procedure and treatment not carried out because of patient's decision for other reasons: Secondary | ICD-10-CM | POA: Insufficient documentation

## 2024-07-26 DIAGNOSIS — Z7984 Long term (current) use of oral hypoglycemic drugs: Secondary | ICD-10-CM | POA: Diagnosis not present

## 2024-07-26 DIAGNOSIS — Z01812 Encounter for preprocedural laboratory examination: Secondary | ICD-10-CM

## 2024-07-26 DIAGNOSIS — E119 Type 2 diabetes mellitus without complications: Secondary | ICD-10-CM | POA: Insufficient documentation

## 2024-07-26 LAB — ABO/RH: ABO/RH(D): A POS

## 2024-07-26 SURGERY — LUMBAR LAMINECTOMY/DECOMPRESSION MICRODISCECTOMY 3 LEVELS
Anesthesia: General

## 2024-07-26 MED ORDER — MIDAZOLAM HCL 2 MG/2ML IJ SOLN
INTRAMUSCULAR | Status: AC
  Filled 2024-07-26: qty 2

## 2024-07-26 MED ORDER — CHLORHEXIDINE GLUCONATE 0.12 % MT SOLN
OROMUCOSAL | Status: AC
  Filled 2024-07-26: qty 15

## 2024-07-26 MED ORDER — CEFAZOLIN IN SODIUM CHLORIDE 2-0.9 GM/100ML-% IV SOLN: 2.0000 g | Freq: Once | INTRAVENOUS | Status: DC

## 2024-07-26 MED ORDER — GABAPENTIN 300 MG PO CAPS: 300.0000 mg | ORAL_CAPSULE | Freq: Once | ORAL | Status: DC

## 2024-07-26 MED ORDER — ACETAMINOPHEN 500 MG PO TABS: 1000.0000 mg | ORAL_TABLET | Freq: Once | ORAL | Status: DC

## 2024-07-26 MED ORDER — PROPOFOL 10 MG/ML IV BOLUS
INTRAVENOUS | Status: AC
  Filled 2024-07-26: qty 20

## 2024-07-26 MED ORDER — CEFAZOLIN SODIUM-DEXTROSE 2-4 GM/100ML-% IV SOLN
INTRAVENOUS | Status: AC
  Filled 2024-07-26: qty 100

## 2024-07-26 MED ORDER — ORAL CARE MOUTH RINSE: 15.0000 mL | Freq: Once | OROMUCOSAL | Status: DC

## 2024-07-26 MED ORDER — SODIUM CHLORIDE 0.9 % IV SOLN: INTRAVENOUS | Status: DC

## 2024-07-26 MED ORDER — CHLORHEXIDINE GLUCONATE 0.12 % MT SOLN: 15.0000 mL | Freq: Once | OROMUCOSAL | Status: DC

## 2024-07-26 MED ORDER — CELECOXIB 200 MG PO CAPS: 200.0000 mg | ORAL_CAPSULE | Freq: Once | ORAL | Status: DC

## 2024-07-26 MED ORDER — FENTANYL CITRATE (PF) 100 MCG/2ML IJ SOLN
INTRAMUSCULAR | Status: AC
  Filled 2024-07-26: qty 2

## 2024-07-26 NOTE — Progress Notes (Signed)
 I met the patient and his wife in the preoperative area.  He states that his severe lower extremity pain has improved significantly.  He feels like his strength is improved significantly as well.  He does continue to have chronic back pain as previously noted.  He also has some symptoms consistent with claudication.  We discussed the risk benefits of going forward with surgery versus more conservative care.  The original recommendation for surgery was due to severe claudicatory symptoms, weakness, and intractable pain.  Thankfully has had a significant improvement in his pain and has the pain has decreased he has also noticed that the claudication symptoms have been less bothersome to him.  We discussed that patients with radiculopathy often do have an improvement in their symptomatology with time and that this may be what he is experiencing.  We did let him know that he is at risk for having another disc herniation and exacerbation especially when returning to heavy work.  They inquired whether or not there were any conservative management options at this time.  I stated that because of the improvement in his weakness and his pain control that we could work with physical therapy to get him optimized to return to work and he can discuss medications like gabapentin  or Lyrica for his lower extremity tingling.  Some of his lower extremity issues are clouded by the presence of diabetic peripheral neuropathy.  Will have him follow-up with us  in approximately 1 month to see how he is doing with physical therapy.  Patient and his wife chose to go forward with conservative management at this time.

## 2024-07-26 NOTE — Telephone Encounter (Signed)
 Surgery with Dr Claudene that was scheduled for today has been canceled bc the patient is feeling better. Post op appointments have been canceled. He will start physical therapy. Dr Claudene has placed a referral to physical therapy and he would like to see him back in about 1 month.

## 2024-07-26 NOTE — Anesthesia Preprocedure Evaluation (Addendum)
 Anesthesia Evaluation  Patient identified by MRN, date of birth, ID band Patient awake    Reviewed: Allergy & Precautions, H&P , NPO status , Patient's Chart, lab work & pertinent test results  Airway Mallampati: II  TM Distance: >3 FB Neck ROM: full    Dental no notable dental hx.    Pulmonary sleep apnea  History of bronchitis years ago. Has albuterol  inhaler that is rarely used. He worked in the yard yesterday and has a slight cough and use of inhaler.    Pulmonary exam normal        Cardiovascular hypertension, Normal cardiovascular exam     Neuro/Psych  Neuromuscular disease  negative psych ROS   GI/Hepatic negative GI ROS, Neg liver ROS,,,  Endo/Other  diabetes, Type 2, Oral Hypoglycemic Agents    Renal/GU      Musculoskeletal   Abdominal  (+) + obese  Peds  Hematology negative hematology ROS (+)   Anesthesia Other Findings Past Medical History: No date: DM (diabetes mellitus), type 2 (HCC) No date: History of Bell's palsy No date: History of gastroesophageal reflux (GERD) No date: Hypertension No date: Lumbar stenosis with neurogenic claudication No date: Sleep apnea     Comment:  does not use cpap  Past Surgical History: No date: NO PAST SURGERIES     Reproductive/Obstetrics negative OB ROS                              Anesthesia Physical Anesthesia Plan  ASA: 2  Anesthesia Plan: General ETT   Post-op Pain Management: Toradol  IV (intra-op)* and Ofirmev  IV (intra-op)*   Induction: Intravenous  PONV Risk Score and Plan: 2 and Ondansetron , Dexamethasone  and Midazolam   Airway Management Planned: Oral ETT  Additional Equipment:   Intra-op Plan:   Post-operative Plan: Extubation in OR  Informed Consent: I have reviewed the patients History and Physical, chart, labs and discussed the procedure including the risks, benefits and alternatives for the proposed  anesthesia with the patient or authorized representative who has indicated his/her understanding and acceptance.     Dental Advisory Given  Plan Discussed with: CRNA and Surgeon  Anesthesia Plan Comments:          Anesthesia Quick Evaluation

## 2024-07-30 NOTE — Telephone Encounter (Signed)
 Mychart message has been sent to the patient regarding follow up appointment.

## 2024-07-31 ENCOUNTER — Other Ambulatory Visit: Payer: Self-pay | Admitting: Physician Assistant

## 2024-07-31 ENCOUNTER — Telehealth: Payer: Self-pay | Admitting: Neurosurgery

## 2024-07-31 MED ORDER — GABAPENTIN 300 MG PO CAPS: 300.0000 mg | ORAL_CAPSULE | Freq: Three times a day (TID) | ORAL | 2 refills | Status: AC

## 2024-07-31 NOTE — Telephone Encounter (Signed)
 Patient scheduled.

## 2024-07-31 NOTE — Telephone Encounter (Signed)
 Patient states that he would like to try the medicine Dr.Smith reccommended for his nerve pain. He could not remember the exact name but he said this medication was mentioned to him at the hospital by Dr.Smith and is specifically for nerves. Please advise.

## 2024-08-08 ENCOUNTER — Telehealth: Payer: Self-pay | Admitting: Neurosurgery

## 2024-08-08 ENCOUNTER — Encounter: Admitting: Physician Assistant

## 2024-08-08 NOTE — Telephone Encounter (Signed)
 Sam with [facility] contacted our office to let us  know that the patient declined to come to his appointment without reason.

## 2024-09-03 ENCOUNTER — Encounter: Admitting: Neurosurgery

## 2024-09-05 ENCOUNTER — Ambulatory Visit: Admitting: Neurosurgery

## 2024-09-05 ENCOUNTER — Encounter: Admitting: Neurosurgery

## 2024-09-17 ENCOUNTER — Encounter: Payer: Self-pay | Admitting: Neurosurgery

## 2024-09-17 ENCOUNTER — Ambulatory Visit: Admitting: Neurosurgery

## 2024-09-17 VITALS — BP 122/76 | Ht 68.0 in | Wt 255.0 lb

## 2024-09-17 DIAGNOSIS — M5416 Radiculopathy, lumbar region: Secondary | ICD-10-CM | POA: Diagnosis not present

## 2024-09-17 DIAGNOSIS — M48062 Spinal stenosis, lumbar region with neurogenic claudication: Secondary | ICD-10-CM | POA: Diagnosis not present

## 2024-09-17 MED ORDER — TIZANIDINE HCL 4 MG PO TABS: 2.0000 mg | ORAL_TABLET | Freq: Three times a day (TID) | ORAL | 1 refills | Status: AC | PRN

## 2024-09-17 NOTE — Progress Notes (Signed)
 Referring Physician:  Sherial Bail, MD [address] [place],  [place] [address]  Primary Physician:  Sherial Bail, MD  09/17/2024 Discussed the use of AI scribe software for clinical note transcription with the patient, who gave verbal consent to proceed.  History of Present Illness Johnathan Williams is a 58 year old male with a herniated disc who presents with worsening back pain and numbness.  He experiences worsening lower back pain radiating to the buttocks, described as 'real sore', and more pronounced at night. Current medication has reduced the frequency of waking at night due to pain.  Numbness persists in the little toe, with slight improvement noted. There is no bowel or bladder incontinence.  Walking has improved slightly, but he struggles with maintaining speed and pace. Activities such as cleaning the shop and picking up items may exacerbate symptoms, possibly due to muscle spasms.  He continues therapy despite some worsening symptoms. He has had injections in the past, including treatments in [place] and [place], but does not recall specific details.  Current medications include tizanidine , meloxicam, and gabapentin  three times a day. He also uses over-the-counter ibuprofen and acetaminophen  as needed.  Weakness: none Bowel/Bladder Dysfunction: none  Conservative measures:  Physical therapy: Continuing in PT Multimodal medical therapy including regular antiinflammatories: Meloxicam, Naproxen  Injections: no epidural steroid injections  Past Surgery: none  The symptoms are causing a significant impact on the patient's life.   Review of Systems:  A 10 point review of systems is negative, except for the pertinent positives and negatives detailed in the HPI.  Past Medical History: Past Medical History:  Diagnosis Date   DM (diabetes mellitus), type 2 (HCC)    History of Bell's palsy    History of gastroesophageal reflux (GERD)     Hypertension    Lumbar stenosis with neurogenic claudication    Sleep apnea    does not use cpap    Past Surgical History: Past Surgical History:  Procedure Laterality Date   NO PAST SURGERIES      Allergies: Allergies as of 09/17/2024   (No Known Allergies)    Medications: Outpatient Encounter Medications as of 09/17/2024  Medication Sig   albuterol  (VENTOLIN  HFA) 108 (90 Base) MCG/ACT inhaler Inhale 2 puffs into the lungs.   fluticasone (FLONASE) 50 MCG/ACT nasal spray Place 1-2 sprays into both nostrils daily.   gabapentin  (NEURONTIN ) 300 MG capsule Take 1 capsule (300 mg total) by mouth 3 (three) times daily.   glipiZIDE (GLUCOTROL XL) 10 MG 24 hr tablet Take 1 tablet by mouth daily.   Ibuprofen-Acetaminophen  (ADVIL DUAL ACTION) 125-250 MG TABS Take 1-2 tablets by mouth daily as needed (pain).   losartan (COZAAR) 50 MG tablet Take 50 mg by mouth daily.   meloxicam (MOBIC) 15 MG tablet Take 15 mg by mouth daily.   metFORMIN (GLUCOPHAGE) 1000 MG tablet Take 1,000 mg by mouth 2 (two) times daily with a meal.   pioglitazone (ACTOS) 30 MG tablet Take 30 mg by mouth.   tadalafil (CIALIS) 10 MG tablet TAKE 1 TABLET BY MOUTH ONCE DAILY AS NEEDED FOR ERECTILE DYSFUNCTION. TAKE 30-45 MINS PRIOR TO ACTIVITY   [DISCONTINUED] tiZANidine  (ZANAFLEX ) 4 MG tablet Take 0.5-1 tablets (2-4 mg total) by mouth every 8 (eight) hours as needed.   tiZANidine  (ZANAFLEX ) 4 MG tablet Take 0.5-1 tablets (2-4 mg total) by mouth every 8 (eight) hours as needed.   No facility-administered encounter medications on file as of 09/17/2024.    Social History: Social  History   Tobacco Use   Smoking status: Never   Smokeless tobacco: Never  Vaping Use   Vaping status: Never Used  Substance Use Topics   Alcohol use: Not Currently   Drug use: Not Currently    Family Medical History: History reviewed. No pertinent family history.  Physical Examination:   NEUROLOGICAL:     Awake, alert, oriented to  person, place, and time.  Speech is clear and fluent. Fund of knowledge is appropriate.   Cranial Nerves: Pupils equal round and reactive to light.  Facial tone is symmetric.   ROM of spine: Tenderness to palpation of his lumbar paraspinals.  Positive straight leg raise   Strength:  Side Iliopsoas Quads Hamstring PF DF EHL  R 5 5 5 4  but pain limited due to previous surgery 4+ +4  L 5 5 5 5  4+ 5   Reflexes are 1+ at bilateral patella, absent Achilles reflex.  Hoffman's is absent.  Clonus is not present.  Toes are down-going.  Numbness to posterior lateral calf and right lower extremity as well as the dorsum and plantar aspect of his right foot. Patient has difficulty with tandem gait.  Medical Decision Making  Imaging: Narrative & Impression  CLINICAL DATA:  Lumbar radiculopathy, symptoms persist with > 6 wks treatment. Low back pain. Bilateral foot numbness.   EXAM: MRI LUMBAR SPINE WITHOUT CONTRAST   TECHNIQUE: Multiplanar, multisequence MR imaging of the lumbar spine was performed. No intravenous contrast was administered.   COMPARISON:  Lumbar spine MRI 04/08/2014   FINDINGS: Segmentation:  Standard.   Alignment:  Normal.   Vertebrae: No fracture or suspicious marrow lesion. Progressive degenerative endplate changes at L5-S1 including mild to moderate Modic type 1 changes/edema. Multiple Schmorl's nodes in the upper lumbar and lower thoracic spine, mildly progressed from prior (for example a new left paracentral L1 inferior endplate Schmorl's node).   Conus medullaris and cauda equina: Conus extends to the L1 level. Conus and cauda equina appear normal.   Paraspinal and other soft tissues: Unremarkable.   Disc levels:   Disc desiccation throughout the lumbar spine with exception of L2-3. Progressive, severe disc space narrowing at L5-S1 with up to mild narrowing elsewhere. Diffuse congenital narrowing of the lumbar spinal canal due to short pedicles.    T12-L1: Disc bulging and a broad posterior disc protrusion result in mild spinal stenosis, progressed from prior. Patent neural foramina.   L1-2: Disc bulging and mild facet hypertrophy result in mild spinal stenosis without neural foraminal stenosis, similar to prior.   L2-3: Disc bulging and moderate facet and ligamentum flavum hypertrophy result in mild-to-moderate spinal stenosis, mildly progressed. Patent neural foramina.   L3-4: Disc bulging, a new small to moderate-sized right paracentral to subarticular disc extrusion with mild cephalad migration, and moderate facet and ligamentum flavum hypertrophy result in increased, moderate to severe spinal stenosis, moderate to severe right and mild-to-moderate left lateral recess stenosis, and mild bilateral neural foraminal stenosis. Potential right L4 nerve root impingement.   L4-5: Circumferential disc bulging eccentric to the left and severe facet and ligamentum flavum hypertrophy result in severe spinal stenosis and moderate left neural foraminal stenosis, mildly progressed. A more focal right paracentral to subarticular disc protrusion on the prior MRI has regressed with mildly improved right lateral recess patency.   L5-S1: Circumferential disc bulging, a chronic left paracentral disc protrusion, a new right paracentral disc extrusion with cephalad migration to the mid L5 vertebral body level, and moderate facet and ligamentum flavum  hypertrophy result in increased, severe spinal stenosis, severe bilateral lateral recess stenosis, and severe right and moderate left neural foraminal stenosis. Potential right L5 and bilateral S1 nerve root impingement.   IMPRESSION: 1. Progressive lumbar disc and facet degeneration with severe spinal stenosis at L4-5 and L5-S1 and moderate to severe spinal stenosis at L3-4. New disc extrusions at L3-4 and L5-S1. 2. Severe right and moderate left neural foraminal stenosis at L5-S1. 3.  Moderate left neural foraminal stenosis at L4-5.     Electronically Signed   By: Dasie Hamburg M.D.   On: 05/03/2024 11:47   Assessment and Plan Assessment & Plan Lumbar spinal stenosis with neurogenic claudication and radiculopathy Chronic lumbar spinal stenosis with neurogenic claudication and radiculopathy, primarily affecting the left buttock and leg. Claudication symptoms bilaterally however. MRI indicates surgical candidacy, but he opted for physical therapy. Surgery could improve walking pace by alleviating nerve compression. - Continue physical therapy. - Follow-up in 4-5 weeks post-therapy. - Refilled tizanidine . - Discussed surgery if symptoms worsen or persist.  [name], MD

## 2024-10-15 ENCOUNTER — Encounter: Admitting: Physician Assistant

## 2024-11-02 ENCOUNTER — Telehealth: Payer: Self-pay | Admitting: Family Medicine

## 2024-11-02 NOTE — Telephone Encounter (Signed)
 LM for patient to return call. He is currently in PT at [facility] and I an checking to see if he will be done before his next appointment on 1/14 or if we should move the appointment out until discharge.

## 2024-11-08 NOTE — Telephone Encounter (Signed)
 He has been discharged from PT at [facility].

## 2024-11-14 ENCOUNTER — Ambulatory Visit (INDEPENDENT_AMBULATORY_CARE_PROVIDER_SITE_OTHER): Admitting: Neurosurgery

## 2024-11-14 ENCOUNTER — Encounter: Payer: Self-pay | Admitting: Neurosurgery

## 2024-11-14 VITALS — BP 142/82 | Ht 68.0 in | Wt 255.0 lb

## 2024-11-14 DIAGNOSIS — M5126 Other intervertebral disc displacement, lumbar region: Secondary | ICD-10-CM

## 2024-11-14 DIAGNOSIS — M5416 Radiculopathy, lumbar region: Secondary | ICD-10-CM

## 2024-11-14 DIAGNOSIS — M48062 Spinal stenosis, lumbar region with neurogenic claudication: Secondary | ICD-10-CM | POA: Diagnosis not present

## 2024-11-14 NOTE — Progress Notes (Signed)
 "  Referring Physician:  Sherial Bail, MD [address] [place],  [place] [address]  Primary Physician:  Sherial Bail, MD  11/14/2024 Discussed the use of AI scribe software for clinical note transcription with the patient, who gave verbal consent to proceed.  History of Present Illness Johnathan Williams is a 59 year old male with lumbar spinal stenosis and herniated lumbar disc who presents for follow-up of persistent left buttock and leg pain.  He has persistent soreness and tenderness in the left buttock, worst in the morning on waking. The pain improves with movement and exercise but recurs with prolonged sitting. He denies low back pain.  For several months he has numbness involving the lateral and plantar left foot, including the little toe, with intermittent soreness in the same region. Symptoms are sometimes relieved by rubbing his back and have remained stable without progression.  Prior heaviness in the legs with walking and prolonged standing has improved, which he attributes to decreased activity.  He continues body weight exercises from physical therapy and avoids weight lifting and golf to prevent worsening of symptoms. He has not had recent steroid treatment and has managed symptoms conservatively for nearly six months.  Weakness: none Bowel/Bladder Dysfunction: none  Conservative measures:  Physical therapy: Continuing in PT Multimodal medical therapy including regular antiinflammatories: Meloxicam, Naproxen  Injections: no epidural steroid injections  Past Surgery: none  The symptoms are causing a significant impact on the patient's life.   Review of Systems:  A 10 point review of systems is negative, except for the pertinent positives and negatives detailed in the HPI.  Past Medical History: Past Medical History:  Diagnosis Date   DM (diabetes mellitus), type 2 (HCC)    History of Bell's palsy    History of gastroesophageal reflux (GERD)     Hypertension    Lumbar stenosis with neurogenic claudication    Sleep apnea    does not use cpap    Past Surgical History: Past Surgical History:  Procedure Laterality Date   NO PAST SURGERIES      Allergies: Allergies as of 11/14/2024   (No Known Allergies)    Medications: Outpatient Encounter Medications as of 11/14/2024  Medication Sig   albuterol  (VENTOLIN  HFA) 108 (90 Base) MCG/ACT inhaler Inhale 2 puffs into the lungs.   fluticasone (FLONASE) 50 MCG/ACT nasal spray Place 1-2 sprays into both nostrils daily.   gabapentin  (NEURONTIN ) 300 MG capsule Take 1 capsule (300 mg total) by mouth 3 (three) times daily.   glipiZIDE (GLUCOTROL XL) 10 MG 24 hr tablet Take 1 tablet by mouth daily.   Ibuprofen-Acetaminophen  (ADVIL DUAL ACTION) 125-250 MG TABS Take 1-2 tablets by mouth daily as needed (pain).   losartan (COZAAR) 50 MG tablet Take 50 mg by mouth daily.   meloxicam (MOBIC) 15 MG tablet Take 15 mg by mouth daily.   metFORMIN (GLUCOPHAGE) 1000 MG tablet Take 1,000 mg by mouth 2 (two) times daily with a meal.   pioglitazone (ACTOS) 30 MG tablet Take 30 mg by mouth.   tadalafil (CIALIS) 10 MG tablet TAKE 1 TABLET BY MOUTH ONCE DAILY AS NEEDED FOR ERECTILE DYSFUNCTION. TAKE 30-45 MINS PRIOR TO ACTIVITY   tiZANidine  (ZANAFLEX ) 4 MG tablet Take 0.5-1 tablets (2-4 mg total) by mouth every 8 (eight) hours as needed.   No facility-administered encounter medications on file as of 11/14/2024.    Social History: Social History   Tobacco Use   Smoking status: Never   Smokeless tobacco: Never  Vaping Use   Vaping status: Never Used  Substance Use Topics   Alcohol use: Not Currently   Drug use: Not Currently    Family Medical History: No family history on file.  Physical Examination:   NEUROLOGICAL:     Awake, alert, oriented to person, place, and time.  Speech is clear and fluent. Fund of knowledge is appropriate.   Cranial Nerves: Pupils equal round and reactive to light.   Facial tone is symmetric.   ROM of spine: Tenderness to palpation of his lumbar paraspinals.  Positive straight leg raise   Strength:  Side Iliopsoas Quads Hamstring PF DF EHL  R 5 5 5  4+ 4+ +4  L 5 5 5 5  4+ 5   Reflexes are 1+ at bilateral patella, absent Achilles reflex.  Hoffman's is absent.  Clonus is not present.  Toes are down-going.  Numbness to posterior lateral calf and right lower extremity as well as the dorsum and plantar aspect of his right foot. Patient has difficulty with tandem gait.  Medical Decision Making  Imaging: Narrative & Impression  CLINICAL DATA:  Lumbar radiculopathy, symptoms persist with > 6 wks treatment. Low back pain. Bilateral foot numbness.   EXAM: MRI LUMBAR SPINE WITHOUT CONTRAST   TECHNIQUE: Multiplanar, multisequence MR imaging of the lumbar spine was performed. No intravenous contrast was administered.   COMPARISON:  Lumbar spine MRI 04/08/2014   FINDINGS: Segmentation:  Standard.   Alignment:  Normal.   Vertebrae: No fracture or suspicious marrow lesion. Progressive degenerative endplate changes at L5-S1 including mild to moderate Modic type 1 changes/edema. Multiple Schmorl's nodes in the upper lumbar and lower thoracic spine, mildly progressed from prior (for example a new left paracentral L1 inferior endplate Schmorl's node).   Conus medullaris and cauda equina: Conus extends to the L1 level. Conus and cauda equina appear normal.   Paraspinal and other soft tissues: Unremarkable.   Disc levels:   Disc desiccation throughout the lumbar spine with exception of L2-3. Progressive, severe disc space narrowing at L5-S1 with up to mild narrowing elsewhere. Diffuse congenital narrowing of the lumbar spinal canal due to short pedicles.   T12-L1: Disc bulging and a broad posterior disc protrusion result in mild spinal stenosis, progressed from prior. Patent neural foramina.   L1-2: Disc bulging and mild facet hypertrophy  result in mild spinal stenosis without neural foraminal stenosis, similar to prior.   L2-3: Disc bulging and moderate facet and ligamentum flavum hypertrophy result in mild-to-moderate spinal stenosis, mildly progressed. Patent neural foramina.   L3-4: Disc bulging, a new small to moderate-sized right paracentral to subarticular disc extrusion with mild cephalad migration, and moderate facet and ligamentum flavum hypertrophy result in increased, moderate to severe spinal stenosis, moderate to severe right and mild-to-moderate left lateral recess stenosis, and mild bilateral neural foraminal stenosis. Potential right L4 nerve root impingement.   L4-5: Circumferential disc bulging eccentric to the left and severe facet and ligamentum flavum hypertrophy result in severe spinal stenosis and moderate left neural foraminal stenosis, mildly progressed. A more focal right paracentral to subarticular disc protrusion on the prior MRI has regressed with mildly improved right lateral recess patency.   L5-S1: Circumferential disc bulging, a chronic left paracentral disc protrusion, a new right paracentral disc extrusion with cephalad migration to the mid L5 vertebral body level, and moderate facet and ligamentum flavum hypertrophy result in increased, severe spinal stenosis, severe bilateral lateral recess stenosis, and severe right and moderate left neural foraminal stenosis. Potential right L5  and bilateral S1 nerve root impingement.   IMPRESSION: 1. Progressive lumbar disc and facet degeneration with severe spinal stenosis at L4-5 and L5-S1 and moderate to severe spinal stenosis at L3-4. New disc extrusions at L3-4 and L5-S1. 2. Severe right and moderate left neural foraminal stenosis at L5-S1. 3. Moderate left neural foraminal stenosis at L4-5.     Electronically Signed   By: Dasie Hamburg M.D.   On: 05/03/2024 11:47   Assessment and Plan Assessment & Plan Lumbar spinal stenosis  with neurogenic claudication and radiculopathy Chronic lumbar spinal stenosis with neurogenic claudication and radiculopathy, presenting with persistent buttock pain, intermittent numbness, and mild weakness. Symptoms have improved but remain, particularly with prolonged standing or activity. No acute neurological deficits or emergent surgical indications. Elective surgical intervention may be considered if symptoms become intolerable or significantly impair occupational or recreational activities. Conservative management is appropriate given current symptom stability and his preference to avoid surgery unless functionally necessary. - Recommended trial of return to work and resumption of activities, including golf, to evaluate functional limitations and impact on quality of life. - Scheduled three-month follow-up to reassess symptoms and functional status. - Advised to notify if unable to work or if symptoms significantly impair daily activities. - Initiated work-related paperwork and leave of absence documentation as requested.  Herniated lumbar disc Herniated lumbar disc at L4-5 and L5-S1 contributing to current symptoms, though presentation is more consistent with chronic nerve compression and degenerative changes rather than acute disc herniation. Short course of oral steroids was discussed for temporary relief but declined after review of limited benefit. - Discussed option of short course of oral steroids for temporary symptom relief; he declined. - Explained that epidural steroid injections are less likely to be effective due to spinal canal narrowing from arthritis and claudication symptoms - Recommended conservative management, with escalation to surgical intervention if symptoms become intolerable or functionally limiting.  [name], MD "

## 2025-02-11 ENCOUNTER — Ambulatory Visit
